# Patient Record
Sex: Male | Born: 1955 | Race: Black or African American | Hispanic: No | Marital: Married | Smoking: Former smoker
Health system: Southern US, Community
[De-identification: ages and names within clinical notes are randomized; demographics above are authoritative.]

## PROBLEM LIST (undated history)

## (undated) DIAGNOSIS — N281 Cyst of kidney, acquired: Secondary | ICD-10-CM

## (undated) DIAGNOSIS — M1990 Unspecified osteoarthritis, unspecified site: Secondary | ICD-10-CM

## (undated) DIAGNOSIS — N433 Hydrocele, unspecified: Secondary | ICD-10-CM

## (undated) DIAGNOSIS — M5430 Sciatica, unspecified side: Secondary | ICD-10-CM

## (undated) DIAGNOSIS — R011 Cardiac murmur, unspecified: Secondary | ICD-10-CM

## (undated) DIAGNOSIS — R897 Abnormal histological findings in specimens from other organs, systems and tissues: Secondary | ICD-10-CM

## (undated) DIAGNOSIS — C801 Malignant (primary) neoplasm, unspecified: Secondary | ICD-10-CM

## (undated) DIAGNOSIS — N503 Cyst of epididymis: Secondary | ICD-10-CM

## (undated) DIAGNOSIS — I10 Essential (primary) hypertension: Secondary | ICD-10-CM

## (undated) DIAGNOSIS — K429 Umbilical hernia without obstruction or gangrene: Secondary | ICD-10-CM

## (undated) DIAGNOSIS — R062 Wheezing: Secondary | ICD-10-CM

## (undated) DIAGNOSIS — N442 Benign cyst of testis: Secondary | ICD-10-CM

## (undated) DIAGNOSIS — E7800 Pure hypercholesterolemia, unspecified: Secondary | ICD-10-CM

## (undated) HISTORY — PX: PROSTATE BIOPSY: SHX241

## (undated) HISTORY — PX: INNER EAR SURGERY: SHX679

## (undated) HISTORY — PX: TONSILLECTOMY: SUR1361

---

## 1978-04-23 HISTORY — PX: CIRCUMCISION: SUR203

## 2006-11-07 ENCOUNTER — Emergency Department (HOSPITAL_COMMUNITY): Admission: EM | Admit: 2006-11-07 | Discharge: 2006-11-07 | Payer: Self-pay | Admitting: Emergency Medicine

## 2009-10-27 ENCOUNTER — Emergency Department (HOSPITAL_COMMUNITY): Admission: EM | Admit: 2009-10-27 | Discharge: 2009-10-27 | Payer: Self-pay | Admitting: Emergency Medicine

## 2010-02-27 ENCOUNTER — Encounter (INDEPENDENT_AMBULATORY_CARE_PROVIDER_SITE_OTHER): Payer: Self-pay | Admitting: Internal Medicine

## 2010-02-27 LAB — CONVERTED CEMR LAB
ALT: 22 units/L (ref 0–53)
Basophils Relative: 1 % (ref 0–1)
Calcium: 10 mg/dL (ref 8.4–10.5)
Creatinine, Ser: 1.07 mg/dL (ref 0.40–1.50)
Eosinophils Absolute: 0.2 10*3/uL (ref 0.0–1.2)
HCT: 41.2 % (ref 36.0–49.0)
Hemoglobin: 14.1 g/dL (ref 12.0–16.0)
LDL Cholesterol: 90 mg/dL (ref 0–109)
Lymphs Abs: 2.4 10*3/uL (ref 1.1–4.8)
MCHC: 34.2 g/dL (ref 31.0–37.0)
PSA: 5.27 ng/mL — ABNORMAL HIGH (ref <=4.00)
Platelets: 210 10*3/uL (ref 150–400)
Sodium: 140 meq/L (ref 135–145)
TSH: 1.111 microintl units/mL (ref 0.700–6.400)
Total CHOL/HDL Ratio: 5.4
Total Protein: 7.3 g/dL (ref 6.0–8.3)
VLDL: 51 mg/dL — ABNORMAL HIGH (ref 0–40)
WBC: 5.5 10*3/uL (ref 4.5–13.5)

## 2010-04-23 DIAGNOSIS — R897 Abnormal histological findings in specimens from other organs, systems and tissues: Secondary | ICD-10-CM

## 2010-04-23 HISTORY — DX: Abnormal histological findings in specimens from other organs, systems and tissues: R89.7

## 2011-03-31 ENCOUNTER — Encounter: Payer: Self-pay | Admitting: *Deleted

## 2011-03-31 ENCOUNTER — Emergency Department (HOSPITAL_COMMUNITY)
Admission: EM | Admit: 2011-03-31 | Discharge: 2011-03-31 | Disposition: A | Discharge status: Home / Self Care | Payer: Self-pay | Source: Home / Self Care | Attending: Emergency Medicine | Admitting: Emergency Medicine

## 2011-03-31 HISTORY — DX: Pure hypercholesterolemia, unspecified: E78.00

## 2011-03-31 HISTORY — DX: Essential (primary) hypertension: I10

## 2011-03-31 MED ORDER — ACETAMINOPHEN-CODEINE #3 300-30 MG PO TABS: 1.0000 | ORAL_TABLET | Freq: Four times a day (QID) | ORAL | Status: AC | PRN

## 2011-03-31 MED ORDER — OSELTAMIVIR PHOSPHATE 75 MG PO CAPS: 75.0000 mg | ORAL_CAPSULE | Freq: Two times a day (BID) | ORAL | Status: AC

## 2011-03-31 NOTE — ED Notes (Signed)
Pt with onset of cough/congestion/fever onset wednesday

## 2011-03-31 NOTE — ED Provider Notes (Addendum)
History     CSN: [id number] Arrival date & time: 03/31/2011  2:24 PM   First MD Initiated Contact with Patient 03/31/11 1319      No chief complaint on file.   (Consider location/radiation/quality/duration/timing/severity/associated sxs/prior treatment) HPI Comments: Coughing at night makes my stomach hurt, still feel congestion in my head" hurts to swallow and having body aches"  Patient is a 55 y.o. male presenting with cough.  Cough This is a new problem. The current episode started more than 2 days ago. The problem occurs hourly. The problem has been gradually worsening. The cough is non-productive. The maximum temperature recorded prior to his arrival was 101 to 101.9 F. Associated symptoms include chills, ear congestion, ear pain, headaches, rhinorrhea, sore throat and myalgias. Pertinent negatives include no weight loss, no shortness of breath and no wheezing. He has tried decongestants for the symptoms. The treatment provided no relief. He is not a smoker. His past medical history is significant for pneumonia. His past medical history does not include emphysema or asthma.    Past Medical History  Diagnosis Date  . Hypertension   . High cholesterol     History reviewed. No pertinent past surgical history.  History reviewed. No pertinent family history.  History  Substance Use Topics  . Smoking status: Former Games developer  . Smokeless tobacco: Not on file  . Alcohol Use: No      Review of Systems  Constitutional: Positive for chills. Negative for weight loss.  HENT: Positive for ear pain, sore throat and rhinorrhea.   Respiratory: Positive for cough. Negative for shortness of breath and wheezing.   Musculoskeletal: Positive for myalgias.  Neurological: Positive for headaches.    Allergies  Review of patient's allergies indicates no known allergies.  Home Medications   Current Outpatient Rx  Name Route Sig Dispense Refill  . AMLODIPINE BESYLATE 5 MG PO TABS Oral  Take 5 mg by mouth daily.      Marland Kitchen DOXAZOSIN MESYLATE 2 MG PO TABS Oral Take 2 mg by mouth at bedtime.      . OMEGA-3 FATTY ACIDS 1000 MG PO CAPS Oral Take 2 g by mouth daily.      Marland Kitchen LISINOPRIL-HYDROCHLOROTHIAZIDE 20-25 MG PO TABS Oral Take 1 tablet by mouth daily.      Marland Kitchen METOPROLOL TARTRATE 50 MG PO TABS Oral Take 100 mg by mouth 2 (two) times daily.      . MULTI-VITAMIN/MINERALS PO TABS Oral Take 1 tablet by mouth daily.      . ACETAMINOPHEN-CODEINE #3 300-30 MG PO TABS Oral Take 1-2 tablets by mouth every 6 (six) hours as needed for pain. 15 tablet 0  . OSELTAMIVIR PHOSPHATE 75 MG PO CAPS Oral Take 1 capsule (75 mg total) by mouth every 12 (twelve) hours. 10 capsule 0    BP 140/79  Pulse 74  Temp(Src) 100.1 F (37.8 C) (Oral)  Resp 20  SpO2 100%  Physical Exam  Nursing note and vitals reviewed. Constitutional: He appears well-developed and well-nourished. No distress.  HENT:  Head: Normocephalic.  Right Ear: Tympanic membrane normal.  Left Ear: Tympanic membrane normal.  Nose: Rhinorrhea present.  Mouth/Throat: Uvula is midline. Posterior oropharyngeal erythema present.  Eyes: Pupils are equal, round, and reactive to light.  Neck: Normal range of motion. No JVD present.  Cardiovascular: Normal rate.   Pulmonary/Chest: Effort normal and breath sounds normal. No respiratory distress. He has no decreased breath sounds. He has no wheezes. He has no rhonchi. He has no  rales.  Abdominal: Soft.  Musculoskeletal: Normal range of motion.  Lymphadenopathy:    He has no cervical adenopathy.  Neurological: He is alert.  Skin: Skin is warm.    ED Course  Procedures (including critical care time)  Labs Reviewed - No data to display No results found.   1. Influenza       MDM  ILI x 3 days. Normal lung exam- Comfortable + risk factors        Jimmie Molly, MD 03/31/11 1515  Jimmie Molly, MD 03/31/11 [phone]

## 2011-10-31 ENCOUNTER — Ambulatory Visit (HOSPITAL_COMMUNITY)
Admission: RE | Admit: 2011-10-31 | Discharge: 2011-10-31 | Disposition: A | Discharge status: Home / Self Care | Payer: Self-pay | Source: Ambulatory Visit | Attending: Family Medicine | Admitting: Family Medicine

## 2011-10-31 DIAGNOSIS — I517 Cardiomegaly: Secondary | ICD-10-CM | POA: Insufficient documentation

## 2011-10-31 DIAGNOSIS — I10 Essential (primary) hypertension: Secondary | ICD-10-CM | POA: Insufficient documentation

## 2011-10-31 DIAGNOSIS — I38 Endocarditis, valve unspecified: Secondary | ICD-10-CM

## 2011-10-31 DIAGNOSIS — E785 Hyperlipidemia, unspecified: Secondary | ICD-10-CM | POA: Insufficient documentation

## 2011-10-31 DIAGNOSIS — R011 Cardiac murmur, unspecified: Secondary | ICD-10-CM | POA: Insufficient documentation

## 2011-11-25 ENCOUNTER — Encounter (HOSPITAL_COMMUNITY): Payer: Self-pay | Admitting: *Deleted

## 2011-11-25 ENCOUNTER — Emergency Department (HOSPITAL_COMMUNITY)
Admission: EM | Admit: 2011-11-25 | Discharge: 2011-11-25 | Disposition: A | Discharge status: Home / Self Care | Payer: Self-pay | Attending: Emergency Medicine | Admitting: Emergency Medicine

## 2011-11-25 DIAGNOSIS — Z87891 Personal history of nicotine dependence: Secondary | ICD-10-CM | POA: Insufficient documentation

## 2011-11-25 DIAGNOSIS — I10 Essential (primary) hypertension: Secondary | ICD-10-CM | POA: Insufficient documentation

## 2011-11-25 DIAGNOSIS — IMO0002 Reserved for concepts with insufficient information to code with codable children: Secondary | ICD-10-CM | POA: Insufficient documentation

## 2011-11-25 DIAGNOSIS — M5416 Radiculopathy, lumbar region: Secondary | ICD-10-CM

## 2011-11-25 DIAGNOSIS — E7800 Pure hypercholesterolemia, unspecified: Secondary | ICD-10-CM | POA: Insufficient documentation

## 2011-11-25 HISTORY — DX: Abnormal histological findings in specimens from other organs, systems and tissues: R89.7

## 2011-11-25 MED ORDER — OXYCODONE-ACETAMINOPHEN 5-325 MG PO TABS: 2.0000 | ORAL_TABLET | Freq: Four times a day (QID) | ORAL | Status: DC | PRN

## 2011-11-25 MED ORDER — PREDNISONE 20 MG PO TABS: ORAL_TABLET | ORAL | Status: AC

## 2011-11-25 NOTE — ED Notes (Signed)
Patient is alert and oriented x.   Patient was explained discharge instructions and no questions were asked.  Patient's wife is here to transport the patient home.

## 2011-11-25 NOTE — ED Notes (Signed)
Reports lower left side back pain that started yesterday, denies injury to back, pain radiates down left leg.

## 2011-11-25 NOTE — Discharge Instructions (Signed)
SEEK IMMEDIATE MEDICAL ATTENTION IF: New numbness, tingling, weakness, or problem with the use of your arms or legs.  Severe back pain not relieved with medications.  Change in bowel or bladder control.  Increasing pain in any areas of the body (such as chest or abdominal pain).  Shortness of breath, dizziness or fainting.  Nausea (feeling sick to your stomach), vomiting, fever, or sweats.  

## 2011-11-25 NOTE — ED Provider Notes (Signed)
History    Scribed for [name], MD, the patient was seen in room TR09C/TR09C. This chart was scribed by Katha Cabal.   CSN: [id number]  Arrival date & time 11/25/11  1811   First MD Initiated Contact with Patient 11/25/11 1941      Chief Complaint  Patient presents with  . Back Pain    (Consider location/radiation/quality/duration/timing/severity/associated sxs/prior treatment) HPI [name], MD entered patient's room at 7:42 PM   Rodney Maynard is a 56 y.o. male who presents to the Emergency Department complaining of sudden onset of severe lower back pain with associated weakness and numbness.  Patient stated he was standing yesterday when pain began.  Pain has been constant and is worse with certain positions.  Patient fell today and reports weakness and numbness today.  Pain radiates down left leg.  Symptoms are not associated with cough, chest pain, fever, abdominal pain, no changes in bladder and bowel changes.  Patient has history of hypertension and hypercholesterolemia.      PCP [facility]     Past Medical History  Diagnosis Date  . Hypertension   . High cholesterol   . Abnormal prostate biopsy 2012    Past Surgical History  Procedure Date  . Circumcision 1980  . Inner ear surgery   . Tonsillectomy     History reviewed. No pertinent family history.  History  Substance Use Topics  . Smoking status: Former Games developer  . Smokeless tobacco: Not on file  . Alcohol Use: No      Review of Systems 10 Systems reviewed and all are negative for acute change except as noted in the HPI.   Allergies  Review of patient's allergies indicates no known allergies.  Home Medications   Current Outpatient Rx  Name Route Sig Dispense Refill  . AMLODIPINE BESYLATE 5 MG PO TABS Oral Take 5 mg by mouth daily.      Marland Kitchen DOXAZOSIN MESYLATE 2 MG PO TABS Oral Take 2 mg by mouth at bedtime.      . OMEGA-3 FATTY ACIDS 1000 MG PO CAPS Oral Take 2 g by mouth  daily.      Marland Kitchen LISINOPRIL-HYDROCHLOROTHIAZIDE 20-25 MG PO TABS Oral Take 1 tablet by mouth daily.      Marland Kitchen METOPROLOL TARTRATE 50 MG PO TABS Oral Take 100 mg by mouth 2 (two) times daily.      . MULTI-VITAMIN/MINERALS PO TABS Oral Take 1 tablet by mouth daily.      . OXYCODONE-ACETAMINOPHEN 5-325 MG PO TABS Oral Take 2 tablets by mouth every 6 (six) hours as needed for pain. 20 tablet 0  . PREDNISONE 20 MG PO TABS  3 tabs po day one, then 2 po daily x 4 days 11 tablet 0    BP 176/102  Pulse 59  Temp 98.3 F (36.8 C) (Oral)  Resp 16  SpO2 96%  Physical Exam  Nursing note and vitals reviewed. Constitutional:       Awake, alert, nontoxic appearance with baseline speech.  HENT:  Head: Atraumatic.  Eyes: Pupils are equal, round, and reactive to light. Right eye exhibits no discharge. Left eye exhibits no discharge.  Neck: Neck supple.  Cardiovascular: Normal rate and regular rhythm.   No murmur heard. Pulmonary/Chest: Effort normal and breath sounds normal. No respiratory distress. He has no wheezes. He has no rales. He exhibits no tenderness.  Abdominal: Soft. Bowel sounds are normal. He exhibits no mass. There is no tenderness. There is  no rebound.  Musculoskeletal:       Thoracic back: He exhibits no tenderness.       Lumbar back: He exhibits no tenderness.       Back non tender.  DP pulse intact bilaterally with good color, decreased light touch to light lateral foot and medial left lower leg as compared to the right  Neurological:       Mental status baseline for patient.  Upper extremity motor strength and sensation intact and symmetric bilaterally.  Skin: No rash noted.  Psychiatric: He has a normal mood and affect.    ED Course  Procedures (including critical care time)   DIAGNOSTIC STUDIES: Oxygen Saturation is 96% on room air, normal by my interpretation.     COORDINATION OF CARE: 7:53 PM  Physical exam complete.   7:57 PM  Plan to discharge patient with steroids and  pain medication.  Patient agrees with plan.        LABS / RADIOLOGY:   Labs Reviewed - No data to display No results found.       MDM         MEDICATIONS GIVEN IN THE E.D. Scheduled Meds:   Continuous Infusions:       IMPRESSION: 1. Lumbar radiculopathy, acute      NEW MEDICATIONS: New Prescriptions   OXYCODONE-ACETAMINOPHEN (PERCOCET) 5-325 MG PER TABLET    Take 2 tablets by mouth every 6 (six) hours as needed for pain.   PREDNISONE (DELTASONE) 20 MG TABLET    3 tabs po day one, then 2 po daily x 4 days    I personally performed the services described in this documentation, which was scribed in my presence. The recorded information has been reviewed and considered.  Patient / Family / Caregiver informed of clinical course, understand medical decision-making process, and agree with plan.           [name], MD 12/01/11 [phone]

## 2011-11-25 NOTE — ED Notes (Signed)
Patient says his pain in his lower back started yesterday but her fell today.  Patient said he has been having muscle spasms in his left leg.  Patient said her fell today and his leg went out from under him.  His pain is severe on his left side and it radiates down his left leg.

## 2011-12-04 ENCOUNTER — Emergency Department (HOSPITAL_COMMUNITY)
Admission: EM | Admit: 2011-12-04 | Discharge: 2011-12-04 | Disposition: A | Discharge status: Home / Self Care | Payer: Self-pay | Attending: Emergency Medicine | Admitting: Emergency Medicine

## 2011-12-04 ENCOUNTER — Encounter (HOSPITAL_COMMUNITY): Payer: Self-pay | Admitting: Emergency Medicine

## 2011-12-04 DIAGNOSIS — IMO0002 Reserved for concepts with insufficient information to code with codable children: Secondary | ICD-10-CM | POA: Insufficient documentation

## 2011-12-04 DIAGNOSIS — M5416 Radiculopathy, lumbar region: Secondary | ICD-10-CM

## 2011-12-04 DIAGNOSIS — I10 Essential (primary) hypertension: Secondary | ICD-10-CM | POA: Insufficient documentation

## 2011-12-04 DIAGNOSIS — E7800 Pure hypercholesterolemia, unspecified: Secondary | ICD-10-CM | POA: Insufficient documentation

## 2011-12-04 DIAGNOSIS — Z87891 Personal history of nicotine dependence: Secondary | ICD-10-CM | POA: Insufficient documentation

## 2011-12-04 MED ORDER — OXYCODONE-ACETAMINOPHEN 5-325 MG PO TABS
2.0000 | ORAL_TABLET | Freq: Once | ORAL | Status: AC
  Administered 2011-12-04: 2 via ORAL
  Filled 2011-12-04: qty 2

## 2011-12-04 MED ORDER — OXYCODONE-ACETAMINOPHEN 5-325 MG PO TABS: 2.0000 | ORAL_TABLET | ORAL | Status: AC | PRN

## 2011-12-04 NOTE — ED Provider Notes (Signed)
Medical screening examination/treatment/procedure(s) were performed by non-physician practitioner and as supervising physician I was immediately available for consultation/collaboration.  Doug Sou, MD 12/04/11 1711

## 2011-12-04 NOTE — Progress Notes (Signed)
[facility] ED CM consulted by Fast track RN to assist with self pay pcp referral for [facility] pt.  He reports not being seen by [facility] in awhile Pt now with back pain going down his leg.  Pt states ED staff has mentioned a referral to a neurologist Triage EDP states neurologist would see pt after information faxed from ED.

## 2011-12-04 NOTE — Progress Notes (Signed)
Pt remains active with [facility] Has an active orange card that recently updated in May 2013 Card expires 03/21/12.  CM spoke with Sofie Hartigan coordinator to see [facility] CM consult possible for a neurologist Pt is also in agreement to "get up" $200 if needed to see a [facility] provider.  CM to fax information to [facility] provider and make an appointment as needed [facility] coordinator in to speak with pt

## 2011-12-04 NOTE — ED Notes (Signed)
Pt will be seen by Case Manager for assistance with follow up care

## 2011-12-04 NOTE — ED Provider Notes (Signed)
History     CSN: [id number]  Arrival date & time 12/04/11  1209   First MD Initiated Contact with Patient 12/04/11 1229      Chief Complaint  Patient presents with  . Back Pain  . Leg Pain    5 day hx pain in low back radiating to l/leg    (Consider location/radiation/quality/duration/timing/severity/associated sxs/prior treatment) HPI Comments: Rodney Maynard 56 y.o. male   The chief complaint is: Patient presents with:   Back Pain   Leg Pain - 5 day hx pain in low back radiating to l/leg    The patient c/o left leg pain . Onset was one week ago.  Start after a twisting motion that occurred with planted feet. He had sudden sharp shooting pain that wrapped around the front of his left leg and to the shin. Pain is 8/10. It has persisited.  He was seen here by Dr. Fonnie Jarvis. Percocet helped him. He was a patient [facility] and is uninsured. He could not afford follow up with the neurologist. Denies weakness, loss of bowel/bladder function or saddle anesthesia. Denies fevers, chills, fatigue, night sweats, unexplained weight loss.     Patient is a 56 y.o. male presenting with back pain and leg pain. The history is provided by the patient and medical records.  Back Pain  Associated symptoms include leg pain. Pertinent negatives include no chest pain, no fever, no dysuria and no weakness.  Leg Pain     Past Medical History  Diagnosis Date  . Hypertension   . High cholesterol   . Abnormal prostate biopsy 2012    Past Surgical History  Procedure Date  . Circumcision 1980  . Inner ear surgery   . Tonsillectomy   . Prostate biopsy     Family History  Problem Relation Age of Onset  . Family history unknown: Yes    History  Substance Use Topics  . Smoking status: Former Games developer  . Smokeless tobacco: Not on file  . Alcohol Use: No      Review of Systems  Constitutional: Negative for fever, chills and fatigue.  Respiratory: Negative for cough and chest  tightness.   Cardiovascular: Negative for chest pain.  Genitourinary: Negative for dysuria.  Musculoskeletal: Positive for back pain.  Neurological: Negative for weakness.    Allergies  Review of patient's allergies indicates no known allergies.  Home Medications   Current Outpatient Rx  Name Route Sig Dispense Refill  . AMLODIPINE BESYLATE 5 MG PO TABS Oral Take 5 mg by mouth daily.      Marland Kitchen DOXAZOSIN MESYLATE 2 MG PO TABS Oral Take 2 mg by mouth at bedtime.      . OMEGA-3 FATTY ACIDS 1000 MG PO CAPS Oral Take 2 g by mouth daily.      . IBUPROFEN 200 MG PO TABS Oral Take 200 mg by mouth every 6 (six) hours as needed. For pain    . LISINOPRIL-HYDROCHLOROTHIAZIDE 20-25 MG PO TABS Oral Take 1 tablet by mouth daily.      Marland Kitchen METOPROLOL TARTRATE 50 MG PO TABS Oral Take 100 mg by mouth 2 (two) times daily.      . MULTI-VITAMIN/MINERALS PO TABS Oral Take 1 tablet by mouth daily.      Marland Kitchen PREDNISONE 20 MG PO TABS  3 tabs po day one, then 2 po daily x 4 days 11 tablet 0  . OXYCODONE-ACETAMINOPHEN 5-325 MG PO TABS Oral Take 2 tablets by mouth every 4 (four) hours as needed  for pain. 40 tablet 0    BP 126/75  Pulse 61  Temp 98.1 F (36.7 C) (Oral)  Resp 18  SpO2 97%  Physical Exam  Nursing note and vitals reviewed. Constitutional: He is oriented to person, place, and time. He appears well-developed and well-nourished.       Patient is clearly uncomfortable.  HENT:  Head: Normocephalic and atraumatic.  Eyes: Conjunctivae are normal. Pupils are equal, round, and reactive to light. No scleral icterus.  Neck: Normal range of motion. Neck supple.  Cardiovascular: Normal rate and regular rhythm.   Pulmonary/Chest: Effort normal and breath sounds normal. No respiratory distress.  Abdominal: Soft. There is no tenderness.  Musculoskeletal:       TTP of the left lumbar region, buttocks, hip, and quads. ROM limited by pain.  Neurological: He is alert and oriented to person, place, and time.        Patient with decreased sensation over the shin of the left leg. DTRs wnl. Gait with limp. No weakness.  Skin: Skin is warm and dry.    ED Course  Procedures (including critical care time)  Labs Reviewed - No data to display No results found.  Patient with lumbar radiculopathy in the L3/L4 distribution. He is in sig. Pain. Trouble sleeping and unable to completehis duties at work. Patient is also uninsured and [facility]. I had social to consult with him for f/u options.  1. Lumbar radiculopathy, acute       MDM  D/c patient with pain medications. He should f/u with Neurology and find a PCP. Discussed reasons to seek immediate care. Patient expresses understanding and agrees with plan.         Arthor Captain, PA-C 12/04/11 1709

## 2011-12-04 NOTE — ED Notes (Signed)
Pt reports leg pain, and back pain x 5 days. Pain is intermittent, burning pain from hip to lower leg

## 2011-12-05 NOTE — Progress Notes (Signed)
[facility] ED CM able to fax referral to [facility] coordinator at [phone] after speaking with receptionist staff [phone] and leaving a voice message for Diane. Diane will call pt to make an appointment if referral approved Cm left her contact number for Diane for further questions Informed her that pt would like appointment any day prior to noon Receive fax confirmation of success at 1213 12/05/11

## 2011-12-05 NOTE — Progress Notes (Signed)
[facility] ED CM left pt a voice message informing him of completed referral to [facility] with pending call to him from Diane for appointment if referral approved.

## 2011-12-28 NOTE — Progress Notes (Signed)
[facility] ED CM worked with [facility] community liasion to attempt to assist pt with obtaining an appointment with [facility] CM, [place] to assist with pcp services. Pt also given contact number for [facility] CM to call as soon as possible. Pt states he had 12/25/11 appointment with neurologist and is scheduled to follow up again with neurologist

## 2012-01-03 ENCOUNTER — Other Ambulatory Visit: Payer: Self-pay | Admitting: Diagnostic Neuroimaging

## 2012-01-03 DIAGNOSIS — M545 Low back pain, unspecified: Secondary | ICD-10-CM

## 2012-01-03 DIAGNOSIS — R209 Unspecified disturbances of skin sensation: Secondary | ICD-10-CM

## 2012-01-18 ENCOUNTER — Other Ambulatory Visit: Payer: Self-pay

## 2012-07-28 ENCOUNTER — Ambulatory Visit: Payer: No Typology Code available for payment source | Attending: Internal Medicine | Admitting: Physical Therapy

## 2012-07-28 DIAGNOSIS — M545 Low back pain, unspecified: Secondary | ICD-10-CM | POA: Insufficient documentation

## 2012-07-28 DIAGNOSIS — M2560 Stiffness of unspecified joint, not elsewhere classified: Secondary | ICD-10-CM | POA: Insufficient documentation

## 2012-07-28 DIAGNOSIS — R293 Abnormal posture: Secondary | ICD-10-CM | POA: Insufficient documentation

## 2012-07-28 DIAGNOSIS — M6281 Muscle weakness (generalized): Secondary | ICD-10-CM | POA: Insufficient documentation

## 2012-07-30 ENCOUNTER — Ambulatory Visit: Payer: No Typology Code available for payment source | Admitting: Rehabilitation

## 2012-08-04 ENCOUNTER — Ambulatory Visit: Payer: No Typology Code available for payment source | Admitting: Rehabilitation

## 2012-08-11 ENCOUNTER — Ambulatory Visit: Payer: No Typology Code available for payment source | Admitting: Rehabilitation

## 2012-08-13 ENCOUNTER — Ambulatory Visit: Payer: No Typology Code available for payment source | Admitting: Rehabilitation

## 2012-08-18 ENCOUNTER — Ambulatory Visit: Payer: No Typology Code available for payment source | Admitting: Physical Therapy

## 2012-08-20 ENCOUNTER — Encounter: Payer: No Typology Code available for payment source | Admitting: Physical Therapy

## 2012-08-21 ENCOUNTER — Ambulatory Visit: Payer: No Typology Code available for payment source | Attending: Internal Medicine | Admitting: Rehabilitation

## 2012-08-21 DIAGNOSIS — M545 Low back pain, unspecified: Secondary | ICD-10-CM | POA: Insufficient documentation

## 2012-08-21 DIAGNOSIS — R293 Abnormal posture: Secondary | ICD-10-CM | POA: Insufficient documentation

## 2012-08-21 DIAGNOSIS — M6281 Muscle weakness (generalized): Secondary | ICD-10-CM | POA: Insufficient documentation

## 2012-08-21 DIAGNOSIS — M2560 Stiffness of unspecified joint, not elsewhere classified: Secondary | ICD-10-CM | POA: Insufficient documentation

## 2012-08-25 ENCOUNTER — Encounter: Payer: No Typology Code available for payment source | Admitting: Physical Therapy

## 2012-08-25 ENCOUNTER — Ambulatory Visit: Payer: No Typology Code available for payment source | Admitting: Physical Therapy

## 2012-08-27 ENCOUNTER — Encounter: Payer: No Typology Code available for payment source | Admitting: Physical Therapy

## 2012-09-01 ENCOUNTER — Ambulatory Visit: Payer: No Typology Code available for payment source | Admitting: Rehabilitation

## 2012-09-08 ENCOUNTER — Encounter: Payer: No Typology Code available for payment source | Admitting: Physical Therapy

## 2013-06-12 ENCOUNTER — Encounter (HOSPITAL_COMMUNITY): Payer: Self-pay | Admitting: Emergency Medicine

## 2013-06-12 ENCOUNTER — Emergency Department (HOSPITAL_COMMUNITY): Payer: No Typology Code available for payment source

## 2013-06-12 ENCOUNTER — Emergency Department (HOSPITAL_COMMUNITY)
Admission: EM | Admit: 2013-06-12 | Discharge: 2013-06-12 | Disposition: A | Discharge status: Home / Self Care | Payer: No Typology Code available for payment source | Attending: Emergency Medicine | Admitting: Emergency Medicine

## 2013-06-12 DIAGNOSIS — IMO0002 Reserved for concepts with insufficient information to code with codable children: Secondary | ICD-10-CM | POA: Insufficient documentation

## 2013-06-12 DIAGNOSIS — W010XXA Fall on same level from slipping, tripping and stumbling without subsequent striking against object, initial encounter: Secondary | ICD-10-CM | POA: Insufficient documentation

## 2013-06-12 DIAGNOSIS — G8929 Other chronic pain: Secondary | ICD-10-CM | POA: Insufficient documentation

## 2013-06-12 DIAGNOSIS — S0990XA Unspecified injury of head, initial encounter: Secondary | ICD-10-CM | POA: Insufficient documentation

## 2013-06-12 DIAGNOSIS — S199XXA Unspecified injury of neck, initial encounter: Principal | ICD-10-CM

## 2013-06-12 DIAGNOSIS — R519 Headache, unspecified: Secondary | ICD-10-CM

## 2013-06-12 DIAGNOSIS — Y9389 Activity, other specified: Secondary | ICD-10-CM | POA: Insufficient documentation

## 2013-06-12 DIAGNOSIS — M542 Cervicalgia: Secondary | ICD-10-CM

## 2013-06-12 DIAGNOSIS — Z79899 Other long term (current) drug therapy: Secondary | ICD-10-CM | POA: Insufficient documentation

## 2013-06-12 DIAGNOSIS — S0993XA Unspecified injury of face, initial encounter: Secondary | ICD-10-CM | POA: Insufficient documentation

## 2013-06-12 DIAGNOSIS — I10 Essential (primary) hypertension: Secondary | ICD-10-CM | POA: Insufficient documentation

## 2013-06-12 DIAGNOSIS — Z7982 Long term (current) use of aspirin: Secondary | ICD-10-CM | POA: Insufficient documentation

## 2013-06-12 DIAGNOSIS — Y929 Unspecified place or not applicable: Secondary | ICD-10-CM | POA: Insufficient documentation

## 2013-06-12 DIAGNOSIS — E7800 Pure hypercholesterolemia, unspecified: Secondary | ICD-10-CM | POA: Insufficient documentation

## 2013-06-12 DIAGNOSIS — W009XXA Unspecified fall due to ice and snow, initial encounter: Secondary | ICD-10-CM

## 2013-06-12 DIAGNOSIS — Z87891 Personal history of nicotine dependence: Secondary | ICD-10-CM | POA: Insufficient documentation

## 2013-06-12 DIAGNOSIS — W1809XA Striking against other object with subsequent fall, initial encounter: Secondary | ICD-10-CM | POA: Insufficient documentation

## 2013-06-12 DIAGNOSIS — R51 Headache: Secondary | ICD-10-CM

## 2013-06-12 MED ORDER — TRAMADOL HCL 50 MG PO TABS: 50.0000 mg | ORAL_TABLET | Freq: Four times a day (QID) | ORAL | Status: DC | PRN

## 2013-06-12 MED ORDER — TRAMADOL HCL 50 MG PO TABS
50.0000 mg | ORAL_TABLET | Freq: Once | ORAL | Status: AC
  Administered 2013-06-12: 50 mg via ORAL
  Filled 2013-06-12: qty 1

## 2013-06-12 NOTE — ED Notes (Signed)
PT ambulated with baseline gait; VSS; A&Ox3; no signs of distress; respirations even and unlabored; skin warm and dry; no questions upon discharge.  

## 2013-06-12 NOTE — ED Provider Notes (Signed)
CSN: [id number]     Arrival date & time 06/12/13  1517 History  This chart was scribed for non-physician practitioner, Noland Fordyce, PA-C working with Osvaldo Shipper, MD by Frederich Balding, ED scribe. This patient was seen in room TR09C/TR09C and the patient's care was started at 7:47 PM.   Chief Complaint  Patient presents with  . Fall   The history is provided by the patient. No language interpreter was used.   HPI Comments: Rodney Maynard is a 58 y.o. male who presents to the Emergency Department complaining of a fall that occurred earlier today. Pt slipped on ice, hit the back of his head and landed on his back. He states he got dizzy after he fell but denies LOC. He has gradual onset, throbbing neck pain that goes into his shoulders. Rates the pain 8/10. He states he is also having lower back pain but it is chronic. Pt has taken motrin with some relief. Denies numbness or tingling.   Past Medical History  Diagnosis Date  . Hypertension   . High cholesterol   . Abnormal prostate biopsy 2012   Past Surgical History  Procedure Laterality Date  . Circumcision  1980  . Inner ear surgery    . Tonsillectomy    . Prostate biopsy     No family history on file. History  Substance Use Topics  . Smoking status: Former Research scientist (life sciences)  . Smokeless tobacco: Not on file  . Alcohol Use: No    Review of Systems  Musculoskeletal: Positive for arthralgias, back pain and neck pain.  Neurological: Negative for numbness.  All other systems reviewed and are negative.   Allergies  Review of patient's allergies indicates no known allergies.  Home Medications   Current Outpatient Rx  Name  Route  Sig  Dispense  Refill  . amLODipine (NORVASC) 5 MG tablet   Oral   Take 5 mg by mouth daily.           Marland Kitchen aspirin 81 MG tablet   Oral   Take 81 mg by mouth daily.         Marland Kitchen doxazosin (CARDURA) 2 MG tablet   Oral   Take 2 mg by mouth at bedtime.           . fish oil-omega-3 fatty acids  1000 MG capsule   Oral   Take 2 g by mouth daily.           Marland Kitchen ibuprofen (ADVIL,MOTRIN) 200 MG tablet   Oral   Take 200 mg by mouth every 6 (six) hours as needed. For pain         . lisinopril-hydrochlorothiazide (PRINZIDE,ZESTORETIC) 20-25 MG per tablet   Oral   Take 1 tablet by mouth daily.           . metoprolol (LOPRESSOR) 100 MG tablet   Oral   Take 50 mg by mouth 2 (two) times daily.         . Multiple Vitamins-Minerals (MULTIVITAMIN WITH MINERALS) tablet   Oral   Take 1 tablet by mouth daily.           . traMADol (ULTRAM) 50 MG tablet   Oral   Take 1 tablet (50 mg total) by mouth every 6 (six) hours as needed.   15 tablet   0    BP 189/101  Pulse 56  Temp(Src) 98 F (36.7 C) (Oral)  Resp 20  SpO2 95%  Physical Exam  Nursing note and vitals reviewed.  Constitutional: He is oriented to person, place, and time. He appears well-developed and well-nourished.  HENT:  Head: Normocephalic and atraumatic.  Eyes: EOM are normal.  Neck: Normal range of motion.  Cardiovascular: Normal rate.   Pulmonary/Chest: Effort normal.  Musculoskeletal: Normal range of motion.  Mildly tender at base of skull. No crepitus or step offs. Skin intact. No ecchymosis or erythema. Full ROM of upper extremities bilaterally.   Neurological: He is alert and oriented to person, place, and time. He has normal strength. No sensory deficit. Gait normal. GCS eye subscore is 4. GCS verbal subscore is 5. GCS motor subscore is 6.  CN II-XII grossly in tact. Alert and oriented x3.  5/5 grip strength. Antalgic gait.  Skin: Skin is warm and dry.  Psychiatric: He has a normal mood and affect. His behavior is normal.    ED Course  Procedures (including critical care time)  DIAGNOSTIC STUDIES: Oxygen Saturation is 95% on RA, adequate by my interpretation.    COORDINATION OF CARE: 7:51 PM-Discussed treatment plan which includes xray and pain medication with pt at bedside and pt agreed to  plan.   Labs Review Labs Reviewed - No data to display Imaging Review Dg Cervical Spine Complete  06/12/2013   CLINICAL DATA:  Fall.  EXAM: CERVICAL SPINE  4+ VIEWS  COMPARISON:  None.  FINDINGS: Soft tissue structures normal. Diffuse degenerative change. Multilevel degenerative endplate osteophyte formation and facet hypertrophy is present. Bilateral neural foraminal narrowing present. Pulmonary apices are clear.  IMPRESSION: Diffuse degenerative change.  No acute abnormality.   Electronically Signed   By: Marcello Moores  Register   On: 06/12/2013 21:16    EKG Interpretation   None       MDM   Final diagnoses:  Neck pain  Fall from slipping on ice  Head pain    Pt c/o headache and neck pain after slipping and falling on ice earlier today. Denies LOC, nausea or change in vision. Normal neuro exam, however pt is tender along base of skull and upper cervical spine. Low concern for fracture, although will get plain films to make sure no fractures or bony deformities.   Plain films: no acute abnormality.   Will tx pain. Rx: tramadol. Advised to f/u with PCP later next week if not improving. Return precautions provided. Pt verbalized understanding and agreement with tx plan.   I personally performed the services described in this documentation, which was scribed in my presence. The recorded information has been reviewed and is accurate.   Noland Fordyce, PA-C 06/13/13 [phone]

## 2013-06-12 NOTE — ED Notes (Signed)
Pt still in xray; wife at bedside; no needs at this time.

## 2013-06-12 NOTE — ED Notes (Signed)
PT reports BP usually runs in 170's but hasn't taken BP meds this evening.

## 2013-06-12 NOTE — ED Notes (Signed)
C collar removed at this time per PA.

## 2013-06-12 NOTE — ED Notes (Signed)
Pt reports hit head on car bumper and feels pain in back of head. Has minor headache. Reports when he hit it pushed his head forward quickly and now has neck pain. Denies LOC. Pt in c collar.

## 2013-06-12 NOTE — ED Notes (Signed)
The pt fell in the ice earlier today.  He has pain in his neck and shoulders since

## 2013-06-13 NOTE — ED Provider Notes (Signed)
Medical screening examination/treatment/procedure(s) were performed by non-physician practitioner and as supervising physician I was immediately available for consultation/collaboration.  EKG Interpretation   None         Osvaldo Shipper, MD 06/13/13 1505

## 2013-10-17 ENCOUNTER — Encounter (HOSPITAL_COMMUNITY): Payer: Self-pay | Admitting: Emergency Medicine

## 2013-10-17 ENCOUNTER — Emergency Department (HOSPITAL_COMMUNITY)
Admission: EM | Admit: 2013-10-17 | Discharge: 2013-10-17 | Disposition: A | Discharge status: Home / Self Care | Payer: No Typology Code available for payment source | Attending: Emergency Medicine | Admitting: Emergency Medicine

## 2013-10-17 ENCOUNTER — Emergency Department (HOSPITAL_COMMUNITY): Payer: No Typology Code available for payment source

## 2013-10-17 DIAGNOSIS — Z8546 Personal history of malignant neoplasm of prostate: Secondary | ICD-10-CM | POA: Insufficient documentation

## 2013-10-17 DIAGNOSIS — M5442 Lumbago with sciatica, left side: Secondary | ICD-10-CM

## 2013-10-17 DIAGNOSIS — Z87891 Personal history of nicotine dependence: Secondary | ICD-10-CM | POA: Insufficient documentation

## 2013-10-17 DIAGNOSIS — I10 Essential (primary) hypertension: Secondary | ICD-10-CM | POA: Insufficient documentation

## 2013-10-17 DIAGNOSIS — M5430 Sciatica, unspecified side: Secondary | ICD-10-CM | POA: Insufficient documentation

## 2013-10-17 HISTORY — DX: Malignant (primary) neoplasm, unspecified: C80.1

## 2013-10-17 MED ORDER — METHOCARBAMOL 500 MG PO TABS
500.0000 mg | ORAL_TABLET | Freq: Once | ORAL | Status: AC
  Administered 2013-10-17: 500 mg via ORAL
  Filled 2013-10-17: qty 1

## 2013-10-17 MED ORDER — METHOCARBAMOL 500 MG PO TABS: 1000.0000 mg | ORAL_TABLET | Freq: Four times a day (QID) | ORAL | Status: DC

## 2013-10-17 MED ORDER — HYDROCODONE-ACETAMINOPHEN 5-325 MG PO TABS: ORAL_TABLET | ORAL | Status: DC

## 2013-10-17 MED ORDER — HYDROCODONE-ACETAMINOPHEN 5-325 MG PO TABS
1.0000 | ORAL_TABLET | Freq: Once | ORAL | Status: AC
  Administered 2013-10-17: 1 via ORAL
  Filled 2013-10-17: qty 1

## 2013-10-17 NOTE — ED Notes (Signed)
Called x 1 no answer

## 2013-10-17 NOTE — Discharge Instructions (Signed)
Please read and follow all provided instructions.  Your diagnoses today include:  1. Left-sided low back pain with left-sided sciatica     Tests performed today include:  Vital signs - see below for your results today  X-ray of back - shows arthritis but no other problems  Medications prescribed:   Vicodin (hydrocodone/acetaminophen) - narcotic pain medication  DO NOT drive or perform any activities that require you to be awake and alert because this medicine can make you drowsy. BE VERY CAREFUL not to take multiple medicines containing Tylenol (also called acetaminophen). Doing so can lead to an overdose which can damage your liver and cause liver failure and possibly death.   Robaxin (methocarbamol) - muscle relaxer medication  DO NOT drive or perform any activities that require you to be awake and alert because this medicine can make you drowsy.   Take any prescribed medications only as directed.  Home care instructions:   Follow any educational materials contained in this packet  Please rest, use ice or heat on your back for the next several days  Do not lift, push, pull anything more than 10 pounds for the next week  Follow-up instructions: Please follow-up with your primary care provider in the next 1 week for further evaluation of your symptoms.   Return instructions:  SEEK IMMEDIATE MEDICAL ATTENTION IF YOU HAVE:  New numbness, tingling, weakness, or problem with the use of your arms or legs  Severe back pain not relieved with medications  Loss control of your bowels or bladder  Increasing pain in any areas of the body (such as chest or abdominal pain)  Shortness of breath, dizziness, or fainting.   Worsening nausea (feeling sick to your stomach), vomiting, fever, or sweats  Any other emergent concerns regarding your health   Additional Information:  Your vital signs today were: BP 139/90   Pulse 61   Temp(Src) 98 F (36.7 C) (Oral)   Resp 20   SpO2  97% If your blood pressure (BP) was elevated above 135/85 this visit, please have this repeated by your doctor within one month. --------------

## 2013-10-17 NOTE — ED Notes (Signed)
Pt states the pain is left lower back and going down the front of his leg. Has sciatica.

## 2013-10-17 NOTE — ED Notes (Signed)
Called x3 no response

## 2013-10-17 NOTE — ED Provider Notes (Signed)
CSN: [id number]     Arrival date & time 10/17/13  1458 History  This chart was scribed for non-physician practitioner working with Wandra Arthurs, MD by Mercy Moore, ED Scribe. This patient was seen in room TR04C/TR04C and the patient's care was started at 4:28 PM.   Chief Complaint  Patient presents with  . Back Pain     The history is provided by the patient. No language interpreter was used.   HPI Comments: Rodney Maynard is a 58 y.o. male with history of sciatica and prostate cancer who presents to the Emergency Department complaining of aching lower left back pain that radiates down the length of left leg, onset two days ago.  Patient denies causative injury or recent trauma. Patient reports elevating the leg last night, but the pain was so discomforting he was unable to sleep. History of similar pain on the right side. Patient reports that his pain was present at his appointment yesterday for his prostate cancer: patient states he was having his PSA levels checked and an updated biospy. Patient reports diarrhea yesterday, but denies bowel issues and urinary symptoms.    Past Medical History  Diagnosis Date  . Hypertension   . High cholesterol   . Abnormal prostate biopsy 2012  . Cancer    Past Surgical History  Procedure Laterality Date  . Circumcision  1980  . Inner ear surgery    . Tonsillectomy    . Prostate biopsy     No family history on file. History  Substance Use Topics  . Smoking status: Former Research scientist (life sciences)  . Smokeless tobacco: Not on file  . Alcohol Use: No    Review of Systems  Constitutional: Negative for fever, chills and unexpected weight change.  Gastrointestinal: Negative for constipation.       Neg for fecal incontinence  Genitourinary: Negative for dysuria, hematuria, flank pain and difficulty urinating.       Negative for urinary incontinence or retention  Musculoskeletal: Positive for back pain.  Neurological: Negative for weakness and numbness.        Negative for saddle paresthesias       Allergies  Review of patient's allergies indicates no known allergies.  Home Medications   Prior to Admission medications   Medication Sig Start Date End Date Taking? Authorizing Chan Rosasco  amLODipine (NORVASC) 5 MG tablet Take 5 mg by mouth daily.      Historical Idelia Caudell, MD  aspirin 81 MG tablet Take 81 mg by mouth daily.    Historical Renell Coaxum, MD  doxazosin (CARDURA) 2 MG tablet Take 2 mg by mouth at bedtime.      Historical Teri Diltz, MD  fish oil-omega-3 fatty acids 1000 MG capsule Take 2 g by mouth daily.      Historical Vayla Wilhelmi, MD  ibuprofen (ADVIL,MOTRIN) 200 MG tablet Take 200 mg by mouth every 6 (six) hours as needed. For pain    Historical Deshannon Hinchliffe, MD  lisinopril-hydrochlorothiazide (PRINZIDE,ZESTORETIC) 20-25 MG per tablet Take 1 tablet by mouth daily.      Historical Savir Blanke, MD  metoprolol (LOPRESSOR) 100 MG tablet Take 50 mg by mouth 2 (two) times daily.    Historical Rodney Wigger, MD  Multiple Vitamins-Minerals (MULTIVITAMIN WITH MINERALS) tablet Take 1 tablet by mouth daily.      Historical Borghild Thaker, MD  traMADol (ULTRAM) 50 MG tablet Take 1 tablet (50 mg total) by mouth every 6 (six) hours as needed. 06/12/13   Noland Fordyce, PA-C   Triage Vitals: BP 154/90  Pulse 62  Temp(Src) 98 F (36.7 C) (Oral)  Resp 20  SpO2 98%  Physical Exam  Nursing note and vitals reviewed. Constitutional: He appears well-developed and well-nourished. No distress.  HENT:  Head: Normocephalic and atraumatic.  Eyes: Conjunctivae and EOM are normal.  Neck: Normal range of motion. Neck supple.  Cardiovascular: Normal rate.   Pulmonary/Chest: Effort normal. No respiratory distress.  Abdominal: Soft. There is no tenderness. There is no CVA tenderness.  Musculoskeletal: Normal range of motion. He exhibits tenderness.       Cervical back: He exhibits normal range of motion, no tenderness and no bony tenderness.       Thoracic back: He exhibits normal  range of motion, no tenderness and no bony tenderness.       Lumbar back: He exhibits tenderness. He exhibits normal range of motion and no bony tenderness.       Back:  No step-off noted with palpation of spine.   Neurological: He is alert. He has normal reflexes. No sensory deficit. He exhibits normal muscle tone.  5/5 strength in entire lower extremities bilaterally. No sensation deficit.   Skin: Skin is warm and dry.  Psychiatric: He has a normal mood and affect. His behavior is normal.    ED Course  Procedures (including critical care time) COORDINATION OF CARE: 4:33 PM- Will order back X-ray. Discussed treatment plan with patient at bedside and patient agreed to plan.   5:37 PM- Will prescribe muscle relaxer to manage pain a home.    Labs Review Labs Reviewed - No data to display  Imaging Review Dg Lumbar Spine Complete  10/17/2013   CLINICAL DATA:  58 year old male with new left side pain, radiculopathy. Initial encounter.  EXAM: LUMBAR SPINE - COMPLETE 4+ VIEW  COMPARISON:  Lumbar radiographs 07/04/2012.  FINDINGS: Normal lumbar segmentation. Bone mineralization is within normal limits. Stable vertebral height and alignment, with mild grade 1 anterolisthesis at L4-L5. Evidence of chronic vacuum disc phenomena at L5-S1. Other disc spaces are relatively preserved. No pars fracture. Sacral ala and SI joints within normal limits. Grossly intact visualized lower thoracic levels.  IMPRESSION: No acute osseous abnormality identified in the lumbar spine. 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 Mild chronic grade 1 anterolisthesis at L4-L5.   Electronically Signed   By: Lars Pinks M.D.   On: 10/17/2013 17:27     EKG Interpretation None      Patient seen and examined. Work-up initiated. Medications ordered.   Vital signs reviewed and are as follows: Filed Vitals:   10/17/13 1731  BP: 139/90  Pulse: 61  Temp:   Resp:     5:44 PM No red flag s/s of low back pain. Patient  was counseled on back pain precautions and told to do activity as tolerated but do not lift, push, or pull heavy objects more than 10 pounds for the next week.  Patient counseled to use ice or heat on back for no longer than 15 minutes every hour.   Patient prescribed muscle relaxer and counseled on proper use of muscle relaxant medication.    Patient prescribed narcotic pain medicine and counseled on proper use of narcotic pain medications. Counseled not to combine this medication with others containing tylenol.   Urged patient not to drink alcohol, drive, or perform any other activities that requires focus while taking either of these medications.  Patient urged to follow-up with PCP if pain does not improve with treatment and rest or if pain becomes recurrent. Urged to return with  worsening severe pain, loss of bowel or bladder control, trouble walking.   The patient verbalizes understanding and agrees with the plan.   MDM   Final diagnoses:  Left-sided low back pain with left-sided sciatica   Patient with back pain. Also cancer history. X-ray neg, ordered due to pain being different location than past. No neurological deficits. Patient is ambulatory. No warning symptoms of back pain including: loss of bowel or bladder control, night sweats, waking from sleep with back pain, unexplained fevers or weight loss, h/o cancer, IVDU, recent trauma. No concern for cauda equina, epidural abscess, or other serious cause of back pain. Conservative measures such as rest, ice/heat and pain medicine indicated with PCP follow-up if no improvement with conservative management.   I personally performed the services described in this documentation, which was scribed in my presence. The recorded information has been reviewed and is accurate.    Carlisle Cater, PA-C 10/17/13 1746

## 2013-10-17 NOTE — ED Notes (Signed)
Patient took OTC's and prescribed medication without any relief of back pain. Patient is unable to tell me when he was seen last by his PCP.

## 2013-10-20 NOTE — ED Provider Notes (Signed)
Medical screening examination/treatment/procedure(s) were performed by non-physician practitioner and as supervising physician I was immediately available for consultation/collaboration.   EKG Interpretation None        Wandra Arthurs, MD 10/20/13 [phone]

## 2013-11-27 ENCOUNTER — Other Ambulatory Visit: Payer: Self-pay | Admitting: Urology

## 2014-01-04 ENCOUNTER — Encounter (HOSPITAL_COMMUNITY): Payer: Self-pay | Admitting: Pharmacy Technician

## 2014-01-11 ENCOUNTER — Encounter (INDEPENDENT_AMBULATORY_CARE_PROVIDER_SITE_OTHER): Payer: Self-pay

## 2014-01-11 ENCOUNTER — Encounter (HOSPITAL_COMMUNITY)
Admission: RE | Admit: 2014-01-11 | Discharge: 2014-01-11 | Disposition: A | Discharge status: Home / Self Care | Payer: No Typology Code available for payment source | Source: Ambulatory Visit | Attending: Urology | Admitting: Urology

## 2014-01-11 ENCOUNTER — Encounter (HOSPITAL_COMMUNITY): Payer: Self-pay

## 2014-01-11 ENCOUNTER — Ambulatory Visit (HOSPITAL_COMMUNITY)
Admission: RE | Admit: 2014-01-11 | Discharge: 2014-01-11 | Disposition: A | Discharge status: Home / Self Care | Payer: No Typology Code available for payment source | Source: Ambulatory Visit | Attending: Anesthesiology | Admitting: Anesthesiology

## 2014-01-11 DIAGNOSIS — Z01818 Encounter for other preprocedural examination: Secondary | ICD-10-CM | POA: Insufficient documentation

## 2014-01-11 DIAGNOSIS — Z87891 Personal history of nicotine dependence: Secondary | ICD-10-CM | POA: Diagnosis not present

## 2014-01-11 DIAGNOSIS — C61 Malignant neoplasm of prostate: Secondary | ICD-10-CM | POA: Insufficient documentation

## 2014-01-11 DIAGNOSIS — I10 Essential (primary) hypertension: Secondary | ICD-10-CM | POA: Insufficient documentation

## 2014-01-11 HISTORY — DX: Unspecified osteoarthritis, unspecified site: M19.90

## 2014-01-11 LAB — BASIC METABOLIC PANEL
Anion gap: 11 (ref 5–15)
BUN: 14 mg/dL (ref 6–23)
CALCIUM: 9.8 mg/dL (ref 8.4–10.5)
CO2: 27 mEq/L (ref 19–32)
CREATININE: 0.98 mg/dL (ref 0.50–1.35)
Chloride: 99 mEq/L (ref 96–112)
GFR calc non Af Amer: 88 mL/min — ABNORMAL LOW (ref >90)
Glucose, Bld: 100 mg/dL — ABNORMAL HIGH (ref 70–99)
Potassium: 4.1 mEq/L (ref 3.7–5.3)
Sodium: 137 mEq/L (ref 137–147)

## 2014-01-11 LAB — ABO/RH: ABO/RH(D): O POS

## 2014-01-11 LAB — CBC
HEMATOCRIT: 37.3 % — AB (ref 39.0–52.0)
Hemoglobin: 12.6 g/dL — ABNORMAL LOW (ref 13.0–17.0)
MCH: 30.1 pg (ref 26.0–34.0)
MCHC: 33.8 g/dL (ref 30.0–36.0)
MCV: 89 fL (ref 78.0–100.0)
Platelets: 213 10*3/uL (ref 150–400)
RBC: 4.19 MIL/uL — ABNORMAL LOW (ref 4.22–5.81)
RDW: 12.9 % (ref 11.5–15.5)
WBC: 5.2 10*3/uL (ref 4.0–10.5)

## 2014-01-11 NOTE — Patient Instructions (Addendum)
20 Rodney Maynard  01/11/2014   Your procedure is scheduled on:  9-23 -2015 Wednesday  Enter through [facility] Entrance and follow signs to [facility] Arrive at      0630  AM.  Call this number if you have problems the morning of surgery: [phone]  Or Presurgical Testing [phone].   For Living Will and/or Health Care Power Attorney Forms: please provide copy for your medical record,may bring AM of surgery(Forms should be already notarized -we do not provide this service).(Yes/ No information preferred today).  Remember: Follow any bowel prep instructions per MD office: Magnesium citrate and Fleet enema-use as per MD instructions. For Cpap use: Bring mask and tubing only.   Do not eat food/ or drink: After Midnight.      Take these medicines the morning of surgery with A SIP OF WATER: Amlodipine. Gabapentin. Metoprolol. Tamsulosin.   Do not wear jewelry, make-up or nail polish.  Do not wear lotions, powders, or perfumes. You may not  wear deodorant.  Do not shave 48 hours(2 days) prior to first CHG shower(legs and under arms).(Shaving face and neck okay.)  Do not bring valuables to the hospital.(Hospital is not responsible for lost valuables).  Contacts, dentures or removable bridgework, body piercing, hair pins may not be worn into surgery.  Leave suitcase in the car. After surgery it may be brought to your room.  For patients admitted to the hospital, checkout time is 11:00 AM the day of discharge.(Restricted visitors-Any Persons displaying flu-like symptoms or illness).    Patients discharged the day of surgery will not be allowed to drive home. Must have responsible person with you x 24 hours once discharged.  Name and phone number of your driver: Rodney Maynard- spouse [phone] cell  Special Instructions: CHG(Chlorhedine 4%-"Hibiclens","Betasept","Aplicare") Shower Use Special Wash: see special instructions.(avoid face and genitals)   Please read over the  following fact sheets that you were given:  Blood Transfusion fact sheet.  Remember : Type/Screen "Blue armbands" - may not be removed once applied(would result in being retested AM of surgery, if removed).  Failure to follow these instructions may result in Cancellation of your surgery.    WHAT IS A BLOOD TRANSFUSION? Blood Transfusion Information  A transfusion is the replacement of blood or some of its parts. Blood is made up of multiple cells which provide different functions.  Red blood cells carry oxygen and are used for blood loss replacement.  White blood cells fight against infection.  Platelets control bleeding.  Plasma helps clot blood.  Other blood products are available for specialized needs, such as hemophilia or other clotting disorders. BEFORE THE TRANSFUSION  Who gives blood for transfusions?   Healthy volunteers who are fully evaluated to make sure their blood is safe. This is blood bank blood. Transfusion therapy is the safest it has ever been in the practice of medicine. Before blood is taken from a donor, a complete history is taken to make sure that person has no history of diseases nor engages in risky social behavior (examples are intravenous drug use or sexual activity with multiple partners). The donor's travel history is screened to minimize risk of transmitting infections, such as malaria. The donated blood is tested for signs of infectious diseases, such as HIV and hepatitis. The blood is then tested to be sure it is compatible with you in order to minimize the chance of a transfusion reaction. If you or a relative donates blood, this is often done in  anticipation of surgery and is not appropriate for emergency situations. It takes many days to process the donated blood. RISKS AND COMPLICATIONS Although transfusion therapy is very safe and saves many lives, the main dangers of transfusion include:   Getting an infectious disease.  Developing a transfusion  reaction. This is an allergic reaction to something in the blood you were given. Every precaution is taken to prevent this. The decision to have a blood transfusion has been considered carefully by your caregiver before blood is given. Blood is not given unless the benefits outweigh the risks. AFTER THE TRANSFUSION  Right after receiving a blood transfusion, you will usually feel much better and more energetic. This is especially true if your red blood cells have gotten low (anemic). The transfusion raises the level of the red blood cells which carry oxygen, and this usually causes an energy increase.  The nurse administering the transfusion will monitor you carefully for complications. HOME CARE INSTRUCTIONS  No special instructions are needed after a transfusion. You may find your energy is better. Speak with your caregiver about any limitations on activity for underlying diseases you may have. SEEK MEDICAL CARE IF:   Your condition is not improving after your transfusion.  You develop redness or irritation at the intravenous (IV) site. SEEK IMMEDIATE MEDICAL CARE IF:  Any of the following symptoms occur over the next 12 hours:  Shaking chills.  You have a temperature by mouth above 102 F (38.9 C), not controlled by medicine.  Chest, back, or muscle pain.  People around you feel you are not acting correctly or are confused.  Shortness of breath or difficulty breathing.  Dizziness and fainting.  You get a rash or develop hives.  You have a decrease in urine output.  Your urine turns a dark color or changes to pink, red, or [place]. Any of the following symptoms occur over the next 10 days:  You have a temperature by mouth above 102 F (38.9 C), not controlled by medicine.  Shortness of breath.  Weakness after normal activity.  The white part of the eye turns yellow (jaundice).  You have a decrease in the amount of urine or are urinating less often.  Your urine turns a  dark color or changes to pink, red, or [place]. Document Released: 04/06/2000 Document Revised: 07/02/2011 Document Reviewed: 11/24/2007 ExitCare Patient Information 2014 [employer].  _____________________________________________________________________________________________________    [facility] - Preparing for Surgery Before surgery, you can play an important role.  Because skin is not sterile, your skin needs to be as free of germs as possible.  You can reduce the number of germs on your skin by washing with CHG (chlorahexidine gluconate) soap before surgery.  CHG is an antiseptic cleaner which kills germs and bonds with the skin to continue killing germs even after washing. Please DO NOT use if you have an allergy to CHG or antibacterial soaps.  If your skin becomes reddened/irritated stop using the CHG and inform your nurse when you arrive at Short Stay. Do not shave (including legs and underarms) for at least 48 hours prior to the first CHG shower.  You may shave your face/neck. Please follow these instructions carefully:  1.  Shower with CHG Soap the night before surgery and the  morning of Surgery.  2.  If you choose to wash your hair, wash your hair first as usual with your  normal  shampoo.  3.  After you shampoo, rinse your hair and body thoroughly to  remove the  shampoo.                           4.  Use CHG as you would any other liquid soap.  You can apply chg directly  to the skin and wash                       Gently with a scrungie or clean washcloth.  5.  Apply the CHG Soap to your body ONLY FROM THE NECK DOWN.   Do not use on face/ open                           Wound or open sores. Avoid contact with eyes, ears mouth and genitals (private parts).                       Wash face,  Genitals (private parts) with your normal soap.             6.  Wash thoroughly, paying special attention to the area where your surgery  will be performed.  7.  Thoroughly rinse your body with  warm water from the neck down.  8.  DO NOT shower/wash with your normal soap after using and rinsing off  the CHG Soap.                9.  Pat yourself dry with a clean towel.            10.  Wear clean pajamas.            11.  Place clean sheets on your bed the night of your first shower and do not  sleep with pets. Day of Surgery : Do not apply any lotions/deodorants the morning of surgery.  Please wear clean clothes to the hospital/surgery center.  FAILURE TO FOLLOW THESE INSTRUCTIONS MAY RESULT IN THE CANCELLATION OF YOUR SURGERY PATIENT SIGNATURE_________________________________  NURSE SIGNATURE__________________________________  ________________________________________________________________________

## 2014-01-11 NOTE — Pre-Procedure Instructions (Signed)
01-11-14 EKG/ CXR done today.

## 2014-01-13 ENCOUNTER — Encounter (HOSPITAL_COMMUNITY)
Admission: RE | Disposition: A | Discharge status: Home / Self Care | Payer: Self-pay | Source: Ambulatory Visit | Attending: Urology

## 2014-01-13 ENCOUNTER — Inpatient Hospital Stay (HOSPITAL_COMMUNITY)
Admission: RE | Admit: 2014-01-13 | Discharge: 2014-01-14 | DRG: 708 | Disposition: A | Discharge status: Home / Self Care | Payer: No Typology Code available for payment source | Source: Ambulatory Visit | Attending: Urology | Admitting: Urology

## 2014-01-13 ENCOUNTER — Encounter (HOSPITAL_COMMUNITY): Payer: Self-pay | Admitting: *Deleted

## 2014-01-13 ENCOUNTER — Inpatient Hospital Stay (HOSPITAL_COMMUNITY): Payer: No Typology Code available for payment source | Admitting: Anesthesiology

## 2014-01-13 ENCOUNTER — Encounter (HOSPITAL_COMMUNITY): Payer: No Typology Code available for payment source | Admitting: Anesthesiology

## 2014-01-13 DIAGNOSIS — Z87891 Personal history of nicotine dependence: Secondary | ICD-10-CM | POA: Diagnosis not present

## 2014-01-13 DIAGNOSIS — C61 Malignant neoplasm of prostate: Principal | ICD-10-CM | POA: Diagnosis present

## 2014-01-13 DIAGNOSIS — Z833 Family history of diabetes mellitus: Secondary | ICD-10-CM | POA: Diagnosis not present

## 2014-01-13 DIAGNOSIS — Z79899 Other long term (current) drug therapy: Secondary | ICD-10-CM

## 2014-01-13 DIAGNOSIS — I10 Essential (primary) hypertension: Secondary | ICD-10-CM | POA: Diagnosis present

## 2014-01-13 DIAGNOSIS — E669 Obesity, unspecified: Secondary | ICD-10-CM | POA: Diagnosis present

## 2014-01-13 DIAGNOSIS — Z6836 Body mass index (BMI) 36.0-36.9, adult: Secondary | ICD-10-CM | POA: Diagnosis not present

## 2014-01-13 DIAGNOSIS — Z23 Encounter for immunization: Secondary | ICD-10-CM

## 2014-01-13 HISTORY — PX: ROBOT ASSISTED LAPAROSCOPIC RADICAL PROSTATECTOMY: SHX5141

## 2014-01-13 HISTORY — PX: LYMPHADENECTOMY: SHX5960

## 2014-01-13 LAB — TYPE AND SCREEN
ABO/RH(D): O POS
Antibody Screen: NEGATIVE

## 2014-01-13 LAB — HEMOGLOBIN AND HEMATOCRIT, BLOOD
HCT: 34.5 % — ABNORMAL LOW (ref 39.0–52.0)
HEMOGLOBIN: 12.1 g/dL — AB (ref 13.0–17.0)

## 2014-01-13 SURGERY — ROBOTIC ASSISTED LAPAROSCOPIC RADICAL PROSTATECTOMY
Anesthesia: General

## 2014-01-13 MED ORDER — PROPOFOL 10 MG/ML IV BOLUS
INTRAVENOUS | Status: AC
  Filled 2014-01-13: qty 20

## 2014-01-13 MED ORDER — LIDOCAINE HCL (CARDIAC) 20 MG/ML IV SOLN
INTRAVENOUS | Status: DC | PRN
  Administered 2014-01-13: 50 mg via INTRAVENOUS

## 2014-01-13 MED ORDER — NEOSTIGMINE METHYLSULFATE 10 MG/10ML IV SOLN
INTRAVENOUS | Status: DC | PRN
  Administered 2014-01-13: 4 mg via INTRAVENOUS

## 2014-01-13 MED ORDER — FENTANYL CITRATE 0.05 MG/ML IJ SOLN
INTRAMUSCULAR | Status: AC
  Filled 2014-01-13: qty 5

## 2014-01-13 MED ORDER — SODIUM CHLORIDE 0.9 % IV BOLUS (SEPSIS)
1000.0000 mL | Freq: Once | INTRAVENOUS | Status: AC
  Administered 2014-01-13: 1000 mL via INTRAVENOUS

## 2014-01-13 MED ORDER — PROMETHAZINE HCL 25 MG/ML IJ SOLN: 6.2500 mg | INTRAMUSCULAR | Status: DC | PRN

## 2014-01-13 MED ORDER — BUPIVACAINE-EPINEPHRINE (PF) 0.25% -1:200000 IJ SOLN
INTRAMUSCULAR | Status: AC
  Filled 2014-01-13: qty 30

## 2014-01-13 MED ORDER — LISINOPRIL 20 MG PO TABS
20.0000 mg | ORAL_TABLET | Freq: Every day | ORAL | Status: DC
  Administered 2014-01-13 – 2014-01-14 (×2): 20 mg via ORAL
  Filled 2014-01-13 (×2): qty 1

## 2014-01-13 MED ORDER — HYDROMORPHONE HCL 1 MG/ML IJ SOLN: 0.2500 mg | INTRAMUSCULAR | Status: DC | PRN

## 2014-01-13 MED ORDER — PROPOFOL 10 MG/ML IV BOLUS
INTRAVENOUS | Status: DC | PRN
  Administered 2014-01-13: 150 mg via INTRAVENOUS

## 2014-01-13 MED ORDER — ACETAMINOPHEN 325 MG PO TABS: 650.0000 mg | ORAL_TABLET | ORAL | Status: DC | PRN

## 2014-01-13 MED ORDER — FENTANYL CITRATE 0.05 MG/ML IJ SOLN
INTRAMUSCULAR | Status: DC | PRN
  Administered 2014-01-13 (×3): 50 ug via INTRAVENOUS
  Administered 2014-01-13: 100 ug via INTRAVENOUS

## 2014-01-13 MED ORDER — SODIUM CHLORIDE 0.9 % IJ SOLN
INTRAMUSCULAR | Status: AC
  Filled 2014-01-13: qty 10

## 2014-01-13 MED ORDER — LACTATED RINGERS IV SOLN
INTRAVENOUS | Status: DC | PRN
  Administered 2014-01-13: 09:00:00

## 2014-01-13 MED ORDER — ALBUTEROL SULFATE HFA 108 (90 BASE) MCG/ACT IN AERS
INHALATION_SPRAY | RESPIRATORY_TRACT | Status: DC | PRN
  Administered 2014-01-13 (×2): 2 via RESPIRATORY_TRACT

## 2014-01-13 MED ORDER — ONDANSETRON HCL 4 MG/2ML IJ SOLN
INTRAMUSCULAR | Status: AC
  Filled 2014-01-13: qty 2

## 2014-01-13 MED ORDER — CISATRACURIUM BESYLATE (PF) 10 MG/5ML IV SOLN
INTRAVENOUS | Status: DC | PRN
  Administered 2014-01-13 (×2): 2 mg via INTRAVENOUS

## 2014-01-13 MED ORDER — HYDROCHLOROTHIAZIDE 25 MG PO TABS
25.0000 mg | ORAL_TABLET | Freq: Every day | ORAL | Status: DC
  Administered 2014-01-13 – 2014-01-14 (×2): 25 mg via ORAL
  Filled 2014-01-13 (×2): qty 1

## 2014-01-13 MED ORDER — ONDANSETRON HCL 4 MG/2ML IJ SOLN
INTRAMUSCULAR | Status: DC | PRN
  Administered 2014-01-13: 4 mg via INTRAVENOUS

## 2014-01-13 MED ORDER — LACTATED RINGERS IV SOLN
INTRAVENOUS | Status: DC
  Administered 2014-01-13: 1000 mL via INTRAVENOUS
  Administered 2014-01-13: 10:00:00 via INTRAVENOUS

## 2014-01-13 MED ORDER — BUPIVACAINE-EPINEPHRINE 0.25% -1:200000 IJ SOLN
INTRAMUSCULAR | Status: DC | PRN
  Administered 2014-01-13: 30 mL

## 2014-01-13 MED ORDER — ALBUTEROL SULFATE HFA 108 (90 BASE) MCG/ACT IN AERS
INHALATION_SPRAY | RESPIRATORY_TRACT | Status: AC
  Filled 2014-01-13: qty 6.7

## 2014-01-13 MED ORDER — ROCURONIUM BROMIDE 100 MG/10ML IV SOLN
INTRAVENOUS | Status: AC
  Filled 2014-01-13: qty 1

## 2014-01-13 MED ORDER — CIPROFLOXACIN HCL 500 MG PO TABS: 500.0000 mg | ORAL_TABLET | Freq: Two times a day (BID) | ORAL | Status: DC

## 2014-01-13 MED ORDER — CEFAZOLIN SODIUM-DEXTROSE 2-3 GM-% IV SOLR
INTRAVENOUS | Status: AC
  Filled 2014-01-13: qty 50

## 2014-01-13 MED ORDER — HEPARIN SODIUM (PORCINE) 1000 UNIT/ML IJ SOLN
INTRAMUSCULAR | Status: AC
  Filled 2014-01-13: qty 1

## 2014-01-13 MED ORDER — KETOROLAC TROMETHAMINE 15 MG/ML IJ SOLN
15.0000 mg | Freq: Four times a day (QID) | INTRAMUSCULAR | Status: DC
  Administered 2014-01-13 – 2014-01-14 (×4): 15 mg via INTRAVENOUS
  Filled 2014-01-13 (×6): qty 1

## 2014-01-13 MED ORDER — DEXAMETHASONE SODIUM PHOSPHATE 10 MG/ML IJ SOLN
INTRAMUSCULAR | Status: DC | PRN
  Administered 2014-01-13: 10 mg via INTRAVENOUS

## 2014-01-13 MED ORDER — AMLODIPINE BESYLATE 10 MG PO TABS
10.0000 mg | ORAL_TABLET | Freq: Every morning | ORAL | Status: DC
  Administered 2014-01-14: 10 mg via ORAL
  Filled 2014-01-13: qty 1

## 2014-01-13 MED ORDER — SODIUM CHLORIDE 0.9 % IR SOLN
Status: DC | PRN
  Administered 2014-01-13: 1000 mL

## 2014-01-13 MED ORDER — INFLUENZA VAC SPLIT QUAD 0.5 ML IM SUSY
0.5000 mL | PREFILLED_SYRINGE | INTRAMUSCULAR | Status: AC
  Administered 2014-01-14: 0.5 mL via INTRAMUSCULAR
  Filled 2014-01-13 (×2): qty 0.5

## 2014-01-13 MED ORDER — LIDOCAINE HCL (CARDIAC) 20 MG/ML IV SOLN
INTRAVENOUS | Status: AC
Start: 2014-01-13 — End: 2014-01-13
  Filled 2014-01-13: qty 5

## 2014-01-13 MED ORDER — GLYCOPYRROLATE 0.2 MG/ML IJ SOLN
INTRAMUSCULAR | Status: AC
  Filled 2014-01-13: qty 4

## 2014-01-13 MED ORDER — ROCURONIUM BROMIDE 100 MG/10ML IV SOLN
INTRAVENOUS | Status: DC | PRN
  Administered 2014-01-13: 50 mg via INTRAVENOUS
  Administered 2014-01-13 (×2): 10 mg via INTRAVENOUS

## 2014-01-13 MED ORDER — HYDROMORPHONE HCL 1 MG/ML IJ SOLN
INTRAMUSCULAR | Status: DC | PRN
  Administered 2014-01-13: 0.5 mg via INTRAVENOUS
  Administered 2014-01-13: 1 mg via INTRAVENOUS

## 2014-01-13 MED ORDER — EPHEDRINE SULFATE 50 MG/ML IJ SOLN
INTRAMUSCULAR | Status: AC
  Filled 2014-01-13: qty 1

## 2014-01-13 MED ORDER — MIDAZOLAM HCL 5 MG/5ML IJ SOLN
INTRAMUSCULAR | Status: DC | PRN
  Administered 2014-01-13: 2 mg via INTRAVENOUS

## 2014-01-13 MED ORDER — HYDROMORPHONE HCL 2 MG/ML IJ SOLN
INTRAMUSCULAR | Status: AC
  Filled 2014-01-13: qty 1

## 2014-01-13 MED ORDER — CEFAZOLIN SODIUM-DEXTROSE 2-3 GM-% IV SOLR
2.0000 g | INTRAVENOUS | Status: AC
Start: 2014-01-13 — End: 2014-01-13
  Administered 2014-01-13: 2 g via INTRAVENOUS

## 2014-01-13 MED ORDER — LISINOPRIL-HYDROCHLOROTHIAZIDE 20-25 MG PO TABS: 1.0000 | ORAL_TABLET | Freq: Every morning | ORAL | Status: DC

## 2014-01-13 MED ORDER — STERILE WATER FOR IRRIGATION IR SOLN
Status: DC | PRN
  Administered 2014-01-13: 3000 mL

## 2014-01-13 MED ORDER — HYDROCODONE-ACETAMINOPHEN 5-325 MG PO TABS: 1.0000 | ORAL_TABLET | Freq: Four times a day (QID) | ORAL | Status: DC | PRN

## 2014-01-13 MED ORDER — DEXTROSE-NACL 5-0.45 % IV SOLN
INTRAVENOUS | Status: DC
  Administered 2014-01-13 – 2014-01-14 (×3): via INTRAVENOUS

## 2014-01-13 MED ORDER — MORPHINE SULFATE 2 MG/ML IJ SOLN
2.0000 mg | INTRAMUSCULAR | Status: DC | PRN
  Administered 2014-01-14: 2 mg via INTRAVENOUS
  Filled 2014-01-13: qty 1

## 2014-01-13 MED ORDER — GLYCOPYRROLATE 0.2 MG/ML IJ SOLN
INTRAMUSCULAR | Status: DC | PRN
  Administered 2014-01-13: .8 mg via INTRAVENOUS

## 2014-01-13 MED ORDER — GABAPENTIN 300 MG PO CAPS
300.0000 mg | ORAL_CAPSULE | Freq: Three times a day (TID) | ORAL | Status: DC
  Administered 2014-01-13 – 2014-01-14 (×3): 300 mg via ORAL
  Filled 2014-01-13 (×4): qty 1

## 2014-01-13 MED ORDER — NEOSTIGMINE METHYLSULFATE 10 MG/10ML IV SOLN
INTRAVENOUS | Status: AC
  Filled 2014-01-13: qty 1

## 2014-01-13 MED ORDER — HYDROCODONE-ACETAMINOPHEN 5-325 MG PO TABS
1.0000 | ORAL_TABLET | ORAL | Status: DC | PRN
  Administered 2014-01-14: 2 via ORAL
  Filled 2014-01-13: qty 2

## 2014-01-13 MED ORDER — KETOROLAC TROMETHAMINE 15 MG/ML IJ SOLN
INTRAMUSCULAR | Status: AC
  Filled 2014-01-13: qty 1

## 2014-01-13 MED ORDER — MIDAZOLAM HCL 2 MG/2ML IJ SOLN
INTRAMUSCULAR | Status: AC
  Filled 2014-01-13: qty 2

## 2014-01-13 MED ORDER — EPHEDRINE SULFATE 50 MG/ML IJ SOLN
INTRAMUSCULAR | Status: DC | PRN
  Administered 2014-01-13 (×4): 5 mg via INTRAVENOUS

## 2014-01-13 MED ORDER — ONDANSETRON HCL 4 MG/2ML IJ SOLN: 4.0000 mg | INTRAMUSCULAR | Status: DC | PRN

## 2014-01-13 MED ORDER — METOPROLOL SUCCINATE ER 100 MG PO TB24
100.0000 mg | ORAL_TABLET | Freq: Two times a day (BID) | ORAL | Status: DC
  Administered 2014-01-13 – 2014-01-14 (×2): 100 mg via ORAL
  Filled 2014-01-13 (×2): qty 1

## 2014-01-13 SURGICAL SUPPLY — 49 items
CABLE HIGH FREQUENCY MONO STRZ (ELECTRODE) ×3 IMPLANT
CATH FOLEY 2WAY SLVR 18FR 30CC (CATHETERS) ×3 IMPLANT
CATH ROBINSON RED A/P 16FR (CATHETERS) ×3 IMPLANT
CATH TIEMANN FOLEY 18FR 5CC (CATHETERS) ×3 IMPLANT
CHLORAPREP W/TINT 26ML (MISCELLANEOUS) ×3 IMPLANT
CLIP LIGATING HEM O LOK PURPLE (MISCELLANEOUS) IMPLANT
CLOTH BEACON ORANGE TIMEOUT ST (SAFETY) ×3 IMPLANT
COVER SURGICAL LIGHT HANDLE (MISCELLANEOUS) ×3 IMPLANT
COVER TIP SHEARS 8 DVNC (MISCELLANEOUS) ×2 IMPLANT
COVER TIP SHEARS 8MM DA VINCI (MISCELLANEOUS) ×1
CUTTER ECHEON FLEX ENDO 45 340 (ENDOMECHANICALS) ×3 IMPLANT
DECANTER SPIKE VIAL GLASS SM (MISCELLANEOUS) IMPLANT
DRAPE SURG IRRIG POUCH 19X23 (DRAPES) ×3 IMPLANT
DRSG TEGADERM 2-3/8X2-3/4 SM (GAUZE/BANDAGES/DRESSINGS) ×12 IMPLANT
DRSG TEGADERM 4X4.75 (GAUZE/BANDAGES/DRESSINGS) ×6 IMPLANT
DRSG TEGADERM 6X8 (GAUZE/BANDAGES/DRESSINGS) ×6 IMPLANT
ELECT REM PT RETURN 9FT ADLT (ELECTROSURGICAL) ×3
ELECTRODE REM PT RTRN 9FT ADLT (ELECTROSURGICAL) ×2 IMPLANT
GAUZE SPONGE 2X2 8PLY STRL LF (GAUZE/BANDAGES/DRESSINGS) ×2 IMPLANT
GLOVE BIO SURGEON STRL SZ 6.5 (GLOVE) ×3 IMPLANT
GLOVE BIOGEL M STRL SZ7.5 (GLOVE) ×6 IMPLANT
GOWN STRL REUS W/TWL LRG LVL3 (GOWN DISPOSABLE) IMPLANT
HOLDER FOLEY CATH W/STRAP (MISCELLANEOUS) ×3 IMPLANT
IV LACTATED RINGERS 1000ML (IV SOLUTION) IMPLANT
KIT ACCESSORY DA VINCI DISP (KITS) ×1
KIT ACCESSORY DVNC DISP (KITS) ×2 IMPLANT
NDL SAFETY ECLIPSE 18X1.5 (NEEDLE) ×2 IMPLANT
NEEDLE HYPO 18GX1.5 SHARP (NEEDLE) ×1
PACK ROBOT UROLOGY CUSTOM (CUSTOM PROCEDURE TRAY) ×3 IMPLANT
PEN SKIN MARKING BROAD (MISCELLANEOUS) ×3 IMPLANT
RELOAD GREEN ECHELON 45 (STAPLE) ×3 IMPLANT
SEALER TISSUE G2 CVD JAW 45CM (ENDOMECHANICALS) IMPLANT
SET TUBE IRRIG SUCTION NO TIP (IRRIGATION / IRRIGATOR) ×3 IMPLANT
SOLUTION ELECTROLUBE (MISCELLANEOUS) ×3 IMPLANT
SPONGE GAUZE 2X2 STER 10/PKG (GAUZE/BANDAGES/DRESSINGS) ×1
STAPLER VISISTAT 35W (STAPLE) ×3 IMPLANT
SUT ETHILON 3 0 PS 1 (SUTURE) ×3 IMPLANT
SUT VIC AB 0 CT1 27 (SUTURE) ×1
SUT VIC AB 0 CT1 27XBRD ANTBC (SUTURE) ×2 IMPLANT
SUT VIC AB 0 UR5 27 (SUTURE) ×3 IMPLANT
SUT VIC AB 2-0 SH 27 (SUTURE) ×1
SUT VIC AB 2-0 SH 27X BRD (SUTURE) ×2 IMPLANT
SUT VICRYL 0 UR6 27IN ABS (SUTURE) ×6 IMPLANT
SUT VLOC BARB 180 ABS3/0GR12 (SUTURE) ×6
SUTURE VLOC BRB 180 ABS3/0GR12 (SUTURE) ×4 IMPLANT
SYR 27GX1/2 1ML LL SAFETY (SYRINGE) ×3 IMPLANT
TOWEL OR 17X26 10 PK STRL BLUE (TOWEL DISPOSABLE) IMPLANT
TOWEL OR NON WOVEN STRL DISP B (DISPOSABLE) ×3 IMPLANT
WATER STERILE IRR 1500ML POUR (IV SOLUTION) IMPLANT

## 2014-01-13 NOTE — H&P (Signed)
Reason For Visit    Rodney Maynard is a 58 year old male referred by Dr. 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 Patient was initially diagnosed with low risk disease in 2012 but now has evidence of pathologic progression.   SHIM 11/25  IPSS 12/3   History of Present Illness   Past urologic history from Dr.Ottelin:     Prostate cancer: Elevated PSA of 4.97 in 8/10. I recommend TRUS/BX at that time however he failed to return.  8/12 - PSA 5.7  9/12 TRUS/BX (done at [facility]) - 1 core Rt. base 20% 3+3=6   Treatment: Active surveillance   10/09/13 repeat TRUS/BX: PSA at the time was 7.80. His prostate volume measured51 cc.  Pathology: 6 cores positive for adenocarcinoma with 4 cores 3+3 = 6 and 2 cores 3+4 = 7.       LUTS: He reported nocturia greater than x3 and tells me that when he tried a friend's Flomax he noted complete resolution of his nocturia. He remains on Flomax now and says he only gets up about twice at night. He was taking Flomax but he stopped taking that and is taking something over-the-counter that he gets at the [facility] for $2.     Interval history: He reports no new urologic complaints today.     Past Medical History Problems  1. History of hypertension (V12.59)  Surgical History Problems  1. History of Biopsy Of The Prostate Needle 2. History of Biopsy Of The Prostate Needle 3. History of Ear Surgery 4. History of Elective Circumcision 5. History of Tonsillectomy  Current Meds 1. AmLODIPine Besylate 10 MG Oral Tablet;  Therapy: (Recorded:25Jul2014) to Recorded 2. Doxazosin Mesylate 4 MG Oral Tablet;  Therapy: (Recorded:25Jul2014) to Recorded 3. Gabapentin 300 MG Oral Capsule;  Therapy: (Recorded:25Jul2014) to Recorded 4. Ketoconazole 2 % External Cream;  Therapy: (Recorded:25Jul2014) to Recorded 5. Lisinopril-Hydrochlorothiazide 20-25 MG Oral  Tablet;  Therapy: (Recorded:25Jul2014) to Recorded 6. Metoprolol Tartrate 100 MG Oral Tablet;  Therapy: (Recorded:25Jul2014) to Recorded 7. Multi-Day TABS;  Therapy: (Recorded:25Jul2014) to Recorded 8. Omega-3 1000 MG Oral Capsule;  Therapy: (Recorded:25Jul2014) to Recorded  Allergies Medication  1. No Known Drug Allergies  Family History Problems  1. Family history of Death In The Family Father : Sister   deceased age 70 2. Family history of Death In The Family Mother : Sister   deceased age 46 3. Family history of Diabetes Mellitus (V18.0) : Sister 4. Family history of Family Health Status Number Of Children : Sister   4 sons 3 dau  Social History Problems  1. Denied: History of Alcohol Use 2. Caffeine Use   1-2 per day 3. Former smoker (V15.82)   quit 35+ years ago (2-3 ppd) 4. Marital History - Currently Married 5. Occupation:   Scientist, water quality 6. Tobacco Use (V15.82)   quit 35 yrs ago  Review of Systems  Genitourinary: urinary frequency, nocturia and erectile dysfunction.  Musculoskeletal: back pain and joint pain.    Vitals  Blood Pressure: 146 / 88 Temperature: 98.6 F Heart Rate: 67  Physical Exam Constitutional: Well nourished and well developed . No acute distress.  ENT:. The ears and nose are normal in appearance.  Neck: The appearance of the neck is normal and no neck mass is present.  Pulmonary: No respiratory distress and normal respiratory rhythm and effort.  Cardiovascular: Heart rate and rhythm are normal . No peripheral edema.  Abdomen: The abdomen is soft  and nontender. No masses are palpated. No CVA tenderness. No hernias are palpable. No hepatosplenomegaly noted.  Genitourinary: Examination of the penis demonstrates no discharge, no masses, no lesions and a normal meatus. The scrotum is without lesions. The right epididymis is palpably normal and non-tender. The left epididymis is palpably normal and non-tender. The right testis is non-tender  and without masses. The left testis is non-tender and without masses.  Skin: Normal skin turgor, no visible rash and no visible skin lesions.  Neuro/Psych:. Mood and affect are appropriate.    Assessment Assessed  1. Adenocarcinoma of prostate (185)  Plan Adenocarcinoma of prostate  1. Follow-up Schedule Surgery Office  Follow-up  Status: Complete  Done: [id number]  Discussion/Summary  The patient was counseled about the natural history of prostate cancer and the standard treatment options that are available for prostate cancer. It was explained to him how his age and life expectancy, clinical stage, Gleason score, and PSA affect his prognosis, the decision to proceed with additional staging studies, as well as how that information influences recommended treatment strategies. We discussed the roles for active surveillance, radiation therapy, surgical therapy, androgen deprivation, as well as ablative therapy options for the treatment of prostate cancer as appropriate to his individual cancer situation. We discussed the risks and benefits of these options with regard to their impact on cancer control and also in terms of potential adverse events, complications, and impact on quiality of life particularly related to urinary, bowel, and sexual function. The patient was encouraged to ask questions throughout the discussion today and all questions were answered to his stated satisfaction. In addition, the patient was provided with and/or directed to appropriate resources and literature for further education about prostate cancer and treatment options.   We discussed surgical therapy for prostate cancer including the different available surgical approaches. We discussed, in detail, the risks and expectations of surgery with regard to cancer control, urinary control, and erectile function as well as the expected postoperative recovery process. The risks, potential complications/adverse events of radical  prostatectomy as well as alternative options were explained to the patient.   We discussed surgical therapy for prostate cancer including the different available surgical approaches. We discussed, in detail, the risks and expectations of surgery with regard to cancer control, urinary control, and erectile function as well as the expected postoperative recovery process. Additional risks of surgery including but not limited to bleeding, infection, hernia formation, nerve damage, lymphocele formation, bowel/rectal injury potentially necessitating colostomy, damage to the urinary tract resulting in urine leakage, urethral stricture, and the cardiopulmonary risks such as myocardial infarction, stroke, death, venothromboembolism, etc. were explained. The risk of open surgical conversion for robotic/laparoscopic prostatectomy was also discussed. Patient will like to see proceed with robotic prostatectomy. He would be a candidate for bilateral nerve spare. I would recommend pelvic lymph node dissection. We will arrange surgery for hopefully sometime the next 3-6 weeks.

## 2014-01-13 NOTE — Op Note (Signed)
Preoperative diagnosis: Clinically localized adenocarcinoma of the prostate (clinical stage T1c)  Postoperative diagnosis: Clinically localized adenocarcinoma of the prostate (clinical stage T1c)  Procedure:  1. Robotic assisted laparoscopic radical prostatectomy (bilateral nerve sparing) 2. Bilateral robotic assisted laparoscopic pelvic lymphadenectomy  Surgeon: Rana Snare. M.D.  Resident: Milon Score, MD  Assistant: Clemetine Marker, PA  Anesthesia: General  Complications: None  EBL: See anesthesia  IVF:  See anesthesia  Specimens: 1. Prostate and seminal vesicles 2. Right pelvic lymph nodes 3. Left pelvic lymph nodes  Disposition of specimens: Pathology  Drains: 1. 20 Fr coude catheter 2. # 19 Blake pelvic drain  Indication: Rodney Maynard is a 58 y.o. year old patient with clinically localized prostate cancer.  After a thorough review of the management options for treatment of prostate cancer, he elected to proceed with surgical therapy and the above procedure(s).  We have discussed the potential benefits and risks of the procedure, side effects of the proposed treatment, the likelihood of the patient achieving the goals of the procedure, and any potential problems that might occur during the procedure or recuperation. Informed consent has been obtained.  Description of procedure:  The patient was taken to the operating room and a general anesthetic was administered. He was given preoperative antibiotics, placed in the dorsal lithotomy position, and prepped and draped in the usual sterile fashion. Next a preoperative timeout was performed. A urethral catheter was placed into the bladder and a site was selected near the umbilicus for placement of the camera port. This was placed using a standard open technique which allowed entry into the peritoneal cavity under direct vision and without difficulty. A 12 mm port was placed and a pneumoperitoneum established. The camera  was then used to inspect the abdomen and there was no evidence of any intra-abdominal injuries or other abnormalities. The remaining abdominal ports were then placed. 8 mm robotic ports were placed in the right lower quadrant, left lower quadrant, and far left lateral abdominal wall. A 5 mm port was placed in the right upper quadrant and a 12 mm port was placed in the right lateral abdominal wall for laparoscopic assistance. All ports were placed under direct vision without difficulty. The surgical robot was then docked.   Utilizing the cautery scissors, the bladder was reflected posteriorly allowing entry into the space of Retzius and identification of the endopelvic fascia and prostate. The periprostatic fat was then removed from the prostate allowing full exposure of the endopelvic fascia. The endopelvic fascia was then incised from the apex back to the base of the prostate bilaterally and the underlying levator muscle fibers were swept laterally off the prostate thereby isolating the dorsal venous complex. The dorsal vein was then stapled and divided with a 45 mm Flex Echelon stapler. Attention then turned to the bladder neck which was divided anteriorly thereby allowing entry into the bladder and exposure of the urethral catheter. The catheter balloon was deflated and the catheter was brought into the operative field and used to retract the prostate anteriorly. The posterior bladder neck was then examined and was divided allowing further dissection between the bladder and prostate posteriorly until the vasa deferentia and seminal vessels were identified. The vasa deferentia were isolated, divided, and lifted anteriorly. The seminal vesicles were dissected down to their tips with care to control the seminal vascular arterial blood supply. These structures were then lifted anteriorly and the space between Denonvillier's fascia and the anterior rectum was developed with a combination of sharp  and blunt  dissection. This isolated the vascular pedicles of the prostate.  The lateral prostatic fascia was then sharply incised allowing release of the neurovascular bundles bilaterally. The vascular pedicles of the prostate were then ligated with Weck clips between the prostate and neurovascular bundles and divided with sharp cold scissor dissection resulting in neurovascular bundle preservation. The neurovascular bundles were then separated off the apex of the prostate and urethra bilaterally.  The urethra was then sharply transected allowing the prostate specimen to be disarticulated. The pelvis was copiously irrigated and hemostasis was ensured. There was no evidence for rectal injury.  Attention then turned to the right pelvic sidewall. The fibrofatty tissue between the external iliac vein, confluence of the iliac vessels, hypogastric artery, and Cooper's ligament was dissected free from the pelvic sidewall with care to preserve the obturator nerve. Weck clips were used for lymphostasis and hemostasis. An identical procedure was performed on the contralateral side and the lymphatic packets were removed for permanent pathologic analysis.  Attention then turned to the urethral anastomosis. A 2-0 Vicryl slip knot was placed between Denonvillier's fascia, the posterior bladder neck, and the posterior urethra to reapproximate these structures. A double-armed 3-0 V-locksuture was then used to perform a 360 running tension-free anastomosis between the bladder neck and urethra. A new urethral catheter was then placed into the bladder and irrigated. There were no blood clots within the bladder and the anastomosis appeared to be watertight. A #19 Blake drain was then brought through the left lateral 8 mm port site and positioned appropriately within the pelvis. It was secured to the skin with a nylon suture. The surgical cart was then undocked. The right lateral 12 mm port site was closed at the fascial level with a 0  Vicryl suture placed laparoscopically. All remaining ports were then removed under direct vision. The prostate specimen was removed intact within the Endopouch retrieval bag via the periumbilical camera port site. This fascial opening was closed with two running 0 Vicryl sutures. 0.25% Marcaine was then injected into all port sites and all incisions were reapproximated at the skin level with staples The patient appeared to tolerate the procedure well and without complications. The patient was able to be extubated and transferred to the recovery unit in satisfactory condition.

## 2014-01-13 NOTE — Care Management Note (Signed)
    Page 1 of 1   01/13/2014     2:26:35 PM CARE MANAGEMENT NOTE 01/13/2014  Patient:  Rodney Maynard, Rodney Maynard   Account Number:  [id number]  Date Initiated:  01/13/2014  Documentation initiated by:  Dessa Phi  Subjective/Objective Assessment:   58 Y/O M ADMITTED W/PROSTATE CA.     Action/Plan:   FROM HOME.   Anticipated DC Date:  01/14/2014   Anticipated DC Plan:  [place]  CM consult      Choice offered to / List presented to:             Status of service:  In process, will continue to follow Medicare Important Message given?   (If response is "NO", the following Medicare IM given date fields will be blank) Date Medicare IM given:   Medicare IM given by:   Date Additional Medicare IM given:   Additional Medicare IM given by:    Discharge Disposition:    Per UR Regulation:  Reviewed for med. necessity/level of care/duration of stay  If discussed at [place] of Stay Meetings, dates discussed:    Comments:  01/13/14 Kathryn Linarez RN,BSN NCM [phone] S/P LAP RAD PROSTATECTOMY.NO ANTICIPATED D/C NEEDS.

## 2014-01-13 NOTE — Progress Notes (Signed)
Post-op note  Subjective: The patient is doing well.  No complaints.  Has not amb yet  Objective: Vital signs in last 24 hours: Temp:  [97.4 F (36.3 C)-98.1 F (36.7 C)] 97.4 F (36.3 C) (09/23 1355) Pulse Rate:  [61-84] 77 (09/23 1355) Resp:  [14-26] 14 (09/23 1355) BP: (121-141)/(59-83) 125/72 mmHg (09/23 1355) SpO2:  [97 %-100 %] 98 % (09/23 1355) Weight:  [102.286 kg (225 lb 8 oz)] 102.286 kg (225 lb 8 oz) (09/23 0656)  Intake/Output from previous day:   Intake/Output this shift: Total I/O In: 4000 [I.V.:3000; IV Piggyback:1000] Out: 80 [Urine:55; Drains:25]  Physical Exam:  General: Alert and oriented. Abdomen: Soft, Nondistended. Incisions: Clean and dry. Urine: yellow  Lab Results:  Recent Labs  01/11/14 1205 01/13/14 1320  HGB 12.6* 12.1*  HCT 37.3* 34.5*    Assessment/Plan: POD#0   1) Continue to monitor  2) DVT prophy, clears, IS, amb, pain control   LOS: 0 days   Aldon Hengst 01/13/2014, 4:26 PM

## 2014-01-13 NOTE — Interval H&P Note (Signed)
History and Physical Interval Note:  01/13/2014 8:15 AM  Rodney Maynard  has presented today for surgery, with the diagnosis of Prostate Cancer  The various methods of treatment have been discussed with the patient and family. After consideration of risks, benefits and other options for treatment, the patient has consented to  Procedure(s): ROBOTIC ASSISTED LAPAROSCOPIC RADICAL PROSTATECTOMY (N/A) LYMPHADENECTOMY (Bilateral) as a surgical intervention .  The patient's history has been reviewed, patient examined, no change in status, stable for surgery.  I have reviewed the patient's chart and labs.  Questions were answered to the patient's satisfaction.     Anderson Middlebrooks S

## 2014-01-13 NOTE — Anesthesia Postprocedure Evaluation (Signed)
  Anesthesia Post-op Note  Patient: Rodney Maynard  Procedure(s) Performed: Procedure(s) (LRB): ROBOTIC ASSISTED LAPAROSCOPIC RADICAL PROSTATECTOMY (N/A) LYMPHADENECTOMY (Bilateral)  Patient Location: PACU  Anesthesia Type: General  Level of Consciousness: awake and alert   Airway and Oxygen Therapy: Patient Spontanous Breathing  Post-op Pain: mild  Post-op Assessment: Post-op Vital signs reviewed, Patient's Cardiovascular Status Stable, Respiratory Function Stable, Patent Airway and No signs of Nausea or vomiting  Last Vitals:  Filed Vitals:   01/13/14 1355  BP: 125/72  Pulse: 77  Temp: 36.3 C  Resp: 14    Post-op Vital Signs: stable   Complications: No apparent anesthesia complications

## 2014-01-13 NOTE — Discharge Instructions (Signed)

## 2014-01-13 NOTE — Progress Notes (Signed)
Patient states he had a ?cyst outside his rectum area which popped and drained yesterday morning. The area is still sore. He will talk to Dr. Risa Grill about it in holding area.

## 2014-01-13 NOTE — Anesthesia Preprocedure Evaluation (Addendum)
Anesthesia Evaluation  Patient identified by MRN, date of birth, ID band Patient awake    Reviewed: Allergy & Precautions, H&P , NPO status , Patient's Chart, lab work & pertinent test results  Airway Mallampati: II TM Distance: >3 FB Neck ROM: Full    Dental  (+) Edentulous Upper, Partial Lower   Pulmonary former smoker,  breath sounds clear to auscultation  Pulmonary exam normal       Cardiovascular hypertension, Pt. on medications Rhythm:Regular Rate:Normal     Neuro/Psych negative neurological ROS  negative psych ROS   GI/Hepatic negative GI ROS, Neg liver ROS,   Endo/Other  negative endocrine ROS  Renal/GU negative Renal ROS  negative genitourinary   Musculoskeletal  (+) Arthritis -,   Abdominal (+) + obese,   Peds negative pediatric ROS (+)  Hematology negative hematology ROS (+)   Anesthesia Other Findings   Reproductive/Obstetrics negative OB ROS                          Anesthesia Physical Anesthesia Plan  ASA: II  Anesthesia Plan: General   Post-op Pain Management:    Induction: Intravenous  Airway Management Planned: Oral ETT  Additional Equipment:   Intra-op Plan:   Post-operative Plan: Extubation in OR  Informed Consent: I have reviewed the patients History and Physical, chart, labs and discussed the procedure including the risks, benefits and alternatives for the proposed anesthesia with the patient or authorized representative who has indicated his/her understanding and acceptance.   Dental advisory given  Plan Discussed with: CRNA  Anesthesia Plan Comments:         Anesthesia Quick Evaluation

## 2014-01-13 NOTE — Transfer of Care (Signed)
Immediate Anesthesia Transfer of Care Note  Patient: Rodney Maynard  Procedure(s) Performed: Procedure(s) (LRB): ROBOTIC ASSISTED LAPAROSCOPIC RADICAL PROSTATECTOMY (N/A) LYMPHADENECTOMY (Bilateral)  Patient Location: PACU  Anesthesia Type: General  Level of Consciousness: sedated, patient cooperative and responds to stimulation  Airway & Oxygen Therapy: Patient Spontanous Breathing and Patient connected to face mask oxgen  Post-op Assessment: Report given to PACU RN and Post -op Vital signs reviewed and stable  Post vital signs: Reviewed and stable  Complications: No apparent anesthesia complications

## 2014-01-14 LAB — BASIC METABOLIC PANEL
Anion gap: 12 (ref 5–15)
BUN: 12 mg/dL (ref 6–23)
CO2: 25 mEq/L (ref 19–32)
Calcium: 8.6 mg/dL (ref 8.4–10.5)
Chloride: 98 mEq/L (ref 96–112)
Creatinine, Ser: 0.99 mg/dL (ref 0.50–1.35)
GFR calc Af Amer: >90 mL/min (ref >90)
GFR calc non Af Amer: 87 mL/min — ABNORMAL LOW (ref >90)
GLUCOSE: 141 mg/dL — AB (ref 70–99)
Potassium: 4 mEq/L (ref 3.7–5.3)
SODIUM: 135 meq/L — AB (ref 137–147)

## 2014-01-14 LAB — HEMOGLOBIN AND HEMATOCRIT, BLOOD
HCT: 33 % — ABNORMAL LOW (ref 39.0–52.0)
HEMOGLOBIN: 11.5 g/dL — AB (ref 13.0–17.0)

## 2014-01-14 MED ORDER — BISACODYL 10 MG RE SUPP
10.0000 mg | Freq: Once | RECTAL | Status: AC
  Administered 2014-01-14: 10 mg via RECTAL
  Filled 2014-01-14: qty 1

## 2014-01-14 NOTE — Progress Notes (Signed)
1 Day Post-Op Subjective: Patient reports pain control adequate. Mild soreness. Has ambulated well.  Objective: Vital signs in last 24 hours: Temp:  [97.4 F (36.3 C)-98.7 F (37.1 C)] 98.7 F (37.1 C) (09/24 0506) Pulse Rate:  [73-85] 73 (09/24 0506) Resp:  [14-26] 20 (09/24 0506) BP: (121-143)/(59-85) 139/78 mmHg (09/24 0506) SpO2:  [97 %-100 %] 99 % (09/24 0506)  Intake/Output from previous day: 09/23 0701 - 09/24 0700 In: 6280 [P.O.:780; I.V.:4500; IV Piggyback:1000] Out: 2980 [Urine:2880; Drains:100] Intake/Output this shift:    Physical Exam:  General:alert and no distress Cardiovascular: rrr Lungs:nl effort  GI: not done and soft Incisions: dry Urine:clear Extremities:NE/tenderness  Lab Results:  Recent Labs  01/11/14 1205 01/13/14 1320 01/14/14 0503  HGB 12.6* 12.1* 11.5*  HCT 37.3* 34.5* 33.0*   BMET  Recent Labs  01/11/14 1205 01/14/14 0503  NA 137 135*  K 4.1 4.0  CL 99 98  CO2 27 25  GLUCOSE 100* 141*  BUN 14 12  CREATININE 0.98 0.99  CALCIUM 9.8 8.6   No results found for this basename: LABPT, INR,  in the last 72 hours No results found for this basename: LABURIN,  in the last 72 hours No results found for this or any previous visit.  Studies/Results: No results found.  Assessment/Plan: 1 Day Post-Op, Procedure(s) (LRB): ROBOTIC ASSISTED LAPAROSCOPIC RADICAL PROSTATECTOMY (N/A) LYMPHADENECTOMY (Bilateral)  Ambulate, Incentive spirometry D/C pelvic drain Advance diet Home this am/afternoon   LOS: 1 day   Dinah Lupa S 01/14/2014, 8:06 AM

## 2014-01-14 NOTE — Progress Notes (Signed)
Pt d/c instructions reviewed w/ pt. Pt verbalizes understanding, all questions answered. Pt demonstrated capable care of foley/leg bag and verbalizes understanding w/ teach back. Pt d/c in stable condition to family's car in w/c by NT. Pt in possession of d/c instructions, scripts, and all personal belongings.

## 2014-01-14 NOTE — Discharge Summary (Signed)
  Date of admission: 01/13/2014  Date of discharge: 01/14/2014  Admission diagnosis: Prostate Cancer  Discharge diagnosis: Prostate Cancer  History and Physical: For full details, please see admission history and physical. Briefly, Rodney Maynard is a 58 y.o. gentleman with localized prostate cancer.  After discussing management/treatment options, he elected to proceed with surgical treatment.  Hospital Course: TAL KEMPKER was taken to the operating room on 01/13/2014 and underwent a robotic assisted laparoscopic radical prostatectomy. He tolerated this procedure well and without complications. Postoperatively, he was able to be transferred to a regular hospital room following recovery from anesthesia.  He was able to begin ambulating the night of surgery. He remained hemodynamically stable overnight.  He had excellent urine output with appropriately minimal output from his pelvic drain and his pelvic drain was removed on POD #1.  He was transitioned to oral pain medication, tolerated a clear liquid diet, and had met all discharge criteria and was able to be discharged home later on POD#1.  Laboratory values:  Recent Labs  01/11/14 1205 01/13/14 1320 01/14/14 0503  HGB 12.6* 12.1* 11.5*  HCT 37.3* 34.5* 33.0*    Disposition: Home  Discharge instruction: He was instructed to be ambulatory but to refrain from heavy lifting, strenuous activity, or driving. He was instructed on urethral catheter care.  Discharge medications:     Medication List    STOP taking these medications       fish oil-omega-3 fatty acids 1000 MG capsule     multivitamin with minerals tablet     tamsulosin 0.4 MG Caps capsule  Commonly known as:  FLOMAX      TAKE these medications       amLODipine 10 MG tablet  Commonly known as:  NORVASC  Take 10 mg by mouth every morning.     ciprofloxacin 500 MG tablet  Commonly known as:  CIPRO  Take 1 tablet (500 mg total) by mouth 2 (two) times daily.  Start day prior to office visit for foley removal     gabapentin 300 MG capsule  Commonly known as:  NEURONTIN  Take 300 mg by mouth 3 (three) times daily.     HYDROcodone-acetaminophen 5-325 MG per tablet  Commonly known as:  NORCO  Take 1-2 tablets by mouth every 6 (six) hours as needed.     lisinopril-hydrochlorothiazide 20-25 MG per tablet  Commonly known as:  PRINZIDE,ZESTORETIC  Take 1 tablet by mouth every morning.     metoprolol 200 MG 24 hr tablet  Commonly known as:  TOPROL-XL  Take 100 mg by mouth 2 (two) times daily.        Followup: He will followup in 1 week for catheter removal and to discuss his surgical pathology results.

## 2014-01-15 ENCOUNTER — Encounter (HOSPITAL_COMMUNITY): Payer: Self-pay | Admitting: Urology

## 2014-07-21 ENCOUNTER — Encounter (HOSPITAL_COMMUNITY): Payer: Self-pay | Admitting: *Deleted

## 2014-07-21 ENCOUNTER — Emergency Department (HOSPITAL_COMMUNITY)
Admission: EM | Admit: 2014-07-21 | Discharge: 2014-07-22 | Disposition: A | Discharge status: Home / Self Care | Payer: PPO | Attending: Emergency Medicine | Admitting: Emergency Medicine

## 2014-07-21 DIAGNOSIS — Z8546 Personal history of malignant neoplasm of prostate: Secondary | ICD-10-CM | POA: Diagnosis not present

## 2014-07-21 DIAGNOSIS — M1990 Unspecified osteoarthritis, unspecified site: Secondary | ICD-10-CM | POA: Diagnosis not present

## 2014-07-21 DIAGNOSIS — S0003XA Contusion of scalp, initial encounter: Secondary | ICD-10-CM | POA: Diagnosis not present

## 2014-07-21 DIAGNOSIS — Y9301 Activity, walking, marching and hiking: Secondary | ICD-10-CM | POA: Diagnosis not present

## 2014-07-21 DIAGNOSIS — S0990XA Unspecified injury of head, initial encounter: Secondary | ICD-10-CM | POA: Diagnosis present

## 2014-07-21 DIAGNOSIS — W010XXA Fall on same level from slipping, tripping and stumbling without subsequent striking against object, initial encounter: Secondary | ICD-10-CM | POA: Insufficient documentation

## 2014-07-21 DIAGNOSIS — Y929 Unspecified place or not applicable: Secondary | ICD-10-CM | POA: Insufficient documentation

## 2014-07-21 DIAGNOSIS — Z8639 Personal history of other endocrine, nutritional and metabolic disease: Secondary | ICD-10-CM | POA: Insufficient documentation

## 2014-07-21 DIAGNOSIS — Y998 Other external cause status: Secondary | ICD-10-CM | POA: Insufficient documentation

## 2014-07-21 DIAGNOSIS — I10 Essential (primary) hypertension: Secondary | ICD-10-CM | POA: Diagnosis not present

## 2014-07-21 DIAGNOSIS — S199XXA Unspecified injury of neck, initial encounter: Secondary | ICD-10-CM | POA: Insufficient documentation

## 2014-07-21 DIAGNOSIS — Z79899 Other long term (current) drug therapy: Secondary | ICD-10-CM | POA: Insufficient documentation

## 2014-07-21 DIAGNOSIS — S0083XA Contusion of other part of head, initial encounter: Secondary | ICD-10-CM

## 2014-07-21 DIAGNOSIS — W19XXXA Unspecified fall, initial encounter: Secondary | ICD-10-CM

## 2014-07-21 DIAGNOSIS — Z87891 Personal history of nicotine dependence: Secondary | ICD-10-CM | POA: Diagnosis not present

## 2014-07-21 NOTE — ED Notes (Signed)
Pt states that he tripped and fell onto concrete. Denies LOC. States that he takes baby aspirin a few days a week. Pt has knot to back of head.

## 2014-07-22 ENCOUNTER — Emergency Department (HOSPITAL_COMMUNITY): Payer: PPO

## 2014-07-22 MED ORDER — ACETAMINOPHEN 325 MG PO TABS
650.0000 mg | ORAL_TABLET | Freq: Once | ORAL | Status: AC
  Administered 2014-07-22: 650 mg via ORAL
  Filled 2014-07-22: qty 2

## 2014-07-22 NOTE — Discharge Instructions (Signed)

## 2014-07-22 NOTE — ED Provider Notes (Signed)
CSN: [id number]     Arrival date & time 07/21/14  2344 History  This chart was scribed for Ernestina Patches, MD by Rayfield Citizen, ED Scribe. This patient was seen in room D30C/D30C and the patient's care was started at 1:42 AM.    Chief Complaint  Patient presents with  . Fall  . Head Laceration   Patient is a 59 y.o. male presenting with fall and scalp laceration. The history is provided by the patient. No language interpreter was used.  Fall Associated symptoms include headaches. Pertinent negatives include no chest pain, no abdominal pain and no shortness of breath.  Head Laceration Associated symptoms include headaches. Pertinent negatives include no chest pain, no abdominal pain and no shortness of breath.     HPI Comments: SAVVAS ROPER is a 59 y.o. male with past medical history of HTN, HLD who presents to the Emergency Department complaining of headache and knot in the occipital area after a mechanical fall around 23:00 tonight. Patient explains he was attempting to walk backwards when he tripped and fell backwards onto concrete. He reports mild neck pain at present. He denies LOC, nausea, vomiting. He denies any other concerns at this time.   He takes baby aspirin twice per week; last doses 3/29 and 07/21/14. Last tetanus in 2015.   Past Medical History  Diagnosis Date  . Hypertension   . High cholesterol   . Abnormal prostate biopsy 2012  . Cancer     prostate cancer, bx. 7'15, 2012 bx- was obseved until PSA showed steady increase.  . Arthritis     arthritis back-fingers, disc disease degenerative  with sciatic  pain both legs right > left.   Past Surgical History  Procedure Laterality Date  . Circumcision  1980  . Inner ear surgery Right   . Tonsillectomy    . Prostate biopsy    . Robot assisted laparoscopic radical prostatectomy N/A 01/13/2014    Procedure: ROBOTIC ASSISTED LAPAROSCOPIC RADICAL PROSTATECTOMY;  Surgeon: Bernestine Amass, MD;  Location: [facility];  Service:  Urology;  Laterality: N/A;  . Lymphadenectomy Bilateral 01/13/2014    Procedure: LYMPHADENECTOMY;  Surgeon: Bernestine Amass, MD;  Location: [facility];  Service: Urology;  Laterality: Bilateral;   No family history on file. History  Substance Use Topics  . Smoking status: Former Smoker    Quit date: 01/11/1974  . Smokeless tobacco: Not on file  . Alcohol Use: No    Review of Systems  Constitutional: Negative for fever, activity change, appetite change and fatigue.  HENT: Negative for congestion, facial swelling, rhinorrhea and trouble swallowing.   Eyes: Negative for photophobia and pain.  Respiratory: Negative for cough, chest tightness and shortness of breath.   Cardiovascular: Negative for chest pain and leg swelling.  Gastrointestinal: Negative for nausea, vomiting, abdominal pain, diarrhea and constipation.  Endocrine: Negative for polydipsia and polyuria.  Genitourinary: Negative for dysuria, urgency, decreased urine volume and difficulty urinating.  Musculoskeletal: Positive for neck pain. Negative for back pain and gait problem.  Skin: Positive for wound. Negative for color change and rash.  Allergic/Immunologic: Negative for immunocompromised state.  Neurological: Positive for headaches. Negative for dizziness, syncope, facial asymmetry, speech difficulty, weakness and numbness.  Psychiatric/Behavioral: Negative for confusion, decreased concentration and agitation.     Allergies  Review of patient's allergies indicates no known allergies.  Home Medications   Prior to Admission medications   Medication Sig Start Date End Date Taking? Authorizing Provider  amLODipine (NORVASC) 10 MG tablet  Take 10 mg by mouth every morning.    Historical Provider, MD  ciprofloxacin (CIPRO) 500 MG tablet Take 1 tablet (500 mg total) by mouth 2 (two) times daily. Start day prior to office visit for foley removal 01/13/14   Debbrah Alar, PA-C  gabapentin (NEURONTIN) 300 MG capsule Take 300 mg by  mouth 3 (three) times daily.    Historical Provider, MD  HYDROcodone-acetaminophen (NORCO) 5-325 MG per tablet Take 1-2 tablets by mouth every 6 (six) hours as needed. 01/13/14   Debbrah Alar, PA-C  lisinopril-hydrochlorothiazide (PRINZIDE,ZESTORETIC) 20-25 MG per tablet Take 1 tablet by mouth every morning.     Historical Provider, MD  metoprolol (TOPROL-XL) 200 MG 24 hr tablet Take 100 mg by mouth 2 (two) times daily.    Historical Provider, MD   BP 123/71 mmHg  Pulse 67  Temp(Src) 98.7 F (37.1 C) (Oral)  Resp 18  Ht 5\' 6"  (1.676 m)  Wt 230 lb (104.327 kg)  BMI 37.14 kg/m2  SpO2 96% Physical Exam  Constitutional: He is oriented to person, place, and time. He appears well-developed and well-nourished. No distress.  HENT:  Head: Normocephalic and atraumatic.  Mouth/Throat: No oropharyngeal exudate.  Eyes: Pupils are equal, round, and reactive to light.  6 x 6cm occipital contusion and abrasion   Neck: Normal range of motion. Neck supple.  Midline cervical pain  Cardiovascular: Normal rate, regular rhythm and normal heart sounds.  Exam reveals no gallop and no friction rub.   No murmur heard. Pulmonary/Chest: Effort normal and breath sounds normal. No respiratory distress. He has no wheezes. He has no rales.  Abdominal: Soft. Bowel sounds are normal. He exhibits no distension and no mass. There is no tenderness. There is no rebound and no guarding.  Musculoskeletal: Normal range of motion. He exhibits no edema or tenderness.  Neurological: He is alert and oriented to person, place, and time.  Skin: Skin is warm and dry.  Psychiatric: He has a normal mood and affect.    ED Course  Procedures   DIAGNOSTIC STUDIES: Oxygen Saturation is 97% on RA, adequate by my interpretation.    COORDINATION OF CARE: 1:48 AM Discussed treatment plan with pt at bedside and pt agreed to plan.   Labs Review  Labs Reviewed - No data to display  Imaging Review Ct Head Wo Contrast  07/22/2014    CLINICAL DATA:  Slipped on rock, and fell on the back of the head. Head laceration, with headache and neck pain. Initial encounter.  EXAM: CT HEAD WITHOUT CONTRAST  CT CERVICAL SPINE WITHOUT CONTRAST  TECHNIQUE: Multidetector CT imaging of the head and cervical spine was performed following the standard protocol without intravenous contrast. Multiplanar CT image reconstructions of the cervical spine were also generated.  COMPARISON:  Cervical spine radiographs performed 06/12/2013  FINDINGS: CT HEAD FINDINGS  There is no evidence of acute infarction, mass lesion, or intra- or extra-axial hemorrhage on CT.  Scattered periventricular white matter change likely reflects small vessel ischemic microangiopathy.  The posterior fossa, including the cerebellum, brainstem and fourth ventricle, is within normal limits. The third and lateral ventricles, and basal ganglia are unremarkable in appearance. The cerebral hemispheres are symmetric in appearance, with normal gray-white differentiation. No mass effect or midline shift is seen.  There is no evidence of fracture; visualized osseous structures are unremarkable in appearance. The orbits are within normal limits. There is partial opacification of the left mastoid air cells, left frontal sinus and sphenoid sinus. The patient is  status post right-sided mastoidectomy. The remaining paranasal sinuses are well-aerated. No significant soft tissue abnormalities are seen.  CT CERVICAL SPINE FINDINGS  There is no evidence of acute fracture or subluxation. There is minimal grade 1 anterolisthesis of C3 on C4, reflecting underlying facet disease. Multilevel disc space narrowing is noted along the cervical spine, with anterior and posterior disc osteophyte complexes. Vertebral bodies demonstrate normal height. Prevertebral soft tissues are within normal limits.  The thyroid gland is unremarkable in appearance. The visualized lung apices are clear. No significant soft tissue  abnormalities are seen.  IMPRESSION: 1. No evidence of traumatic intracranial injury or fracture. 2. No evidence of acute fracture or subluxation along the cervical spine. 3. Scattered small vessel ischemic microangiopathy. 4. Mild diffuse degenerative change along the cervical spine. 5. Partial opacification of the left mastoid air cells, left frontal sinus and sphenoid sinus.   Electronically Signed   By: Garald Balding M.D.   On: 07/22/2014 02:43   Ct Cervical Spine Wo Contrast  07/22/2014   CLINICAL DATA:  Slipped on rock, and fell on the back of the head. Head laceration, with headache and neck pain. Initial encounter.  EXAM: CT HEAD WITHOUT CONTRAST  CT CERVICAL SPINE WITHOUT CONTRAST  TECHNIQUE: Multidetector CT imaging of the head and cervical spine was performed following the standard protocol without intravenous contrast. Multiplanar CT image reconstructions of the cervical spine were also generated.  COMPARISON:  Cervical spine radiographs performed 06/12/2013  FINDINGS: CT HEAD FINDINGS  There is no evidence of acute infarction, mass lesion, or intra- or extra-axial hemorrhage on CT.  Scattered periventricular white matter change likely reflects small vessel ischemic microangiopathy.  The posterior fossa, including the cerebellum, brainstem and fourth ventricle, is within normal limits. The third and lateral ventricles, and basal ganglia are unremarkable in appearance. The cerebral hemispheres are symmetric in appearance, with normal gray-white differentiation. No mass effect or midline shift is seen.  There is no evidence of fracture; visualized osseous structures are unremarkable in appearance. The orbits are within normal limits. There is partial opacification of the left mastoid air cells, left frontal sinus and sphenoid sinus. The patient is status post right-sided mastoidectomy. The remaining paranasal sinuses are well-aerated. No significant soft tissue abnormalities are seen.  CT CERVICAL  SPINE FINDINGS  There is no evidence of acute fracture or subluxation. There is minimal grade 1 anterolisthesis of C3 on C4, reflecting underlying facet disease. Multilevel disc space narrowing is noted along the cervical spine, with anterior and posterior disc osteophyte complexes. Vertebral bodies demonstrate normal height. Prevertebral soft tissues are within normal limits.  The thyroid gland is unremarkable in appearance. The visualized lung apices are clear. No significant soft tissue abnormalities are seen.  IMPRESSION: 1. No evidence of traumatic intracranial injury or fracture. 2. No evidence of acute fracture or subluxation along the cervical spine. 3. Scattered small vessel ischemic microangiopathy. 4. Mild diffuse degenerative change along the cervical spine. 5. Partial opacification of the left mastoid air cells, left frontal sinus and sphenoid sinus.   Electronically Signed   By: Garald Balding M.D.   On: 07/22/2014 02:43     EKG Interpretation None     Using new [place] head trauma criteria, CT needed given presence of h/a.  MDM   Final diagnoses:  Fall  Hematoma of occipital surface of head, initial encounter    Pt is a 59 y.o. male with Pmhx as above who presents with h/a, neck pain after mechanical fall. CT head,  c-spine negative. Neuro exam unremarkable. CC is incorrect, pt does not have a head lac, but abrasion, tetanus UTD. Will d/c home     Rosario Jacks evaluation in the Emergency Department is complete. It has been determined that no acute conditions requiring further emergency intervention are present at this time. The patient/guardian have been advised of the diagnosis and plan. We have discussed signs and symptoms that warrant return to the ED, such as changes or worsening in symptoms, worsening h/a, numbness, weakness, confusion.    Medical screening examination/treatment/procedure(s) were performed by non-physician practitioner and as supervising physician I was  immediately available for consultation/collaboration.   EKG Interpretation None           Ernestina Patches, MD 07/22/14 2053

## 2014-07-22 NOTE — ED Notes (Signed)
Pt states that after he fell he was bleeding a little bit. Pt states that there were small spots of blood on a paper towel and that was it.

## 2015-05-27 ENCOUNTER — Ambulatory Visit
Admission: RE | Admit: 2015-05-27 | Discharge: 2015-05-27 | Disposition: A | Discharge status: Home / Self Care | Payer: PPO | Source: Ambulatory Visit | Attending: Internal Medicine | Admitting: Internal Medicine

## 2015-05-27 ENCOUNTER — Other Ambulatory Visit: Payer: Self-pay | Admitting: Internal Medicine

## 2015-05-27 DIAGNOSIS — M25551 Pain in right hip: Secondary | ICD-10-CM

## 2015-09-19 ENCOUNTER — Encounter (HOSPITAL_COMMUNITY): Payer: Self-pay

## 2015-09-19 ENCOUNTER — Emergency Department (HOSPITAL_COMMUNITY): Payer: PPO

## 2015-09-19 ENCOUNTER — Emergency Department (HOSPITAL_COMMUNITY)
Admission: EM | Admit: 2015-09-19 | Discharge: 2015-09-19 | Disposition: A | Discharge status: Home / Self Care | Payer: PPO | Attending: Emergency Medicine | Admitting: Emergency Medicine

## 2015-09-19 DIAGNOSIS — S8991XA Unspecified injury of right lower leg, initial encounter: Secondary | ICD-10-CM | POA: Diagnosis present

## 2015-09-19 DIAGNOSIS — Z8639 Personal history of other endocrine, nutritional and metabolic disease: Secondary | ICD-10-CM | POA: Diagnosis not present

## 2015-09-19 DIAGNOSIS — I10 Essential (primary) hypertension: Secondary | ICD-10-CM | POA: Diagnosis not present

## 2015-09-19 DIAGNOSIS — Z8546 Personal history of malignant neoplasm of prostate: Secondary | ICD-10-CM | POA: Diagnosis not present

## 2015-09-19 DIAGNOSIS — Z792 Long term (current) use of antibiotics: Secondary | ICD-10-CM | POA: Diagnosis not present

## 2015-09-19 DIAGNOSIS — S8001XA Contusion of right knee, initial encounter: Secondary | ICD-10-CM

## 2015-09-19 DIAGNOSIS — Z79899 Other long term (current) drug therapy: Secondary | ICD-10-CM | POA: Insufficient documentation

## 2015-09-19 DIAGNOSIS — Z87891 Personal history of nicotine dependence: Secondary | ICD-10-CM | POA: Diagnosis not present

## 2015-09-19 DIAGNOSIS — M130 Polyarthritis, unspecified: Secondary | ICD-10-CM | POA: Diagnosis not present

## 2015-09-19 DIAGNOSIS — Y998 Other external cause status: Secondary | ICD-10-CM | POA: Insufficient documentation

## 2015-09-19 DIAGNOSIS — W010XXA Fall on same level from slipping, tripping and stumbling without subsequent striking against object, initial encounter: Secondary | ICD-10-CM | POA: Diagnosis not present

## 2015-09-19 DIAGNOSIS — Y9289 Other specified places as the place of occurrence of the external cause: Secondary | ICD-10-CM | POA: Diagnosis not present

## 2015-09-19 DIAGNOSIS — S79911A Unspecified injury of right hip, initial encounter: Secondary | ICD-10-CM | POA: Diagnosis not present

## 2015-09-19 DIAGNOSIS — Y9354 Activity, bowling: Secondary | ICD-10-CM | POA: Diagnosis not present

## 2015-09-19 DIAGNOSIS — M25551 Pain in right hip: Secondary | ICD-10-CM

## 2015-09-19 MED ORDER — HYDROCODONE-ACETAMINOPHEN 5-325 MG PO TABS: 1.0000 | ORAL_TABLET | Freq: Four times a day (QID) | ORAL | Status: DC | PRN

## 2015-09-19 MED ORDER — IBUPROFEN 600 MG PO TABS: 600.0000 mg | ORAL_TABLET | Freq: Four times a day (QID) | ORAL | Status: DC | PRN

## 2015-09-19 NOTE — ED Provider Notes (Signed)
History  By signing my name below, I, Rodney Maynard, attest that this documentation has been prepared under the direction and in the presence of Rodney Maynard, [place]. Electronically Signed: Bea Maynard, ED Scribe. 09/19/2015. 9:04 PM.   Chief Complaint  Patient presents with  . Knee Injury    The history is provided by the patient and medical records. No language interpreter was used.     HPI Comments: Rodney Maynard is a 60 y.o. male with h/o arthritis who presents to the Emergency Department complaining of a right knee injury that occurred last night. He states he was bowling, slipped, landed on the right knee and then dropped the bowling ball on the right knee as well. He reports associated swelling of the right knee. He reports some right hip pain as well from the jarring motion of the fall. He has not taken anything for pain. Walking increases his pain. He denies alleviating factors. He denies head trauma, LOC, bruising, wounds, numbness, tingling or weakness of the LLE.   Past Medical History  Diagnosis Date  . Hypertension   . High cholesterol   . Abnormal prostate biopsy 2012  . Cancer [facility])     prostate cancer, bx. 7'15, 2012 bx- was obseved until PSA showed steady increase.  . Arthritis     arthritis back-fingers, disc disease degenerative  with sciatic  pain both legs right > left.   Past Surgical History  Procedure Laterality Date  . Circumcision  1980  . Inner ear surgery Right   . Tonsillectomy    . Prostate biopsy    . Robot assisted laparoscopic radical prostatectomy N/A 01/13/2014    Procedure: ROBOTIC ASSISTED LAPAROSCOPIC RADICAL PROSTATECTOMY;  Surgeon: Bernestine Amass, MD;  Location: [facility];  Service: Urology;  Laterality: N/A;  . Lymphadenectomy Bilateral 01/13/2014    Procedure: LYMPHADENECTOMY;  Surgeon: Bernestine Amass, MD;  Location: [facility];  Service: Urology;  Laterality: Bilateral;   No family history on file. Social History  Substance Use  Topics  . Smoking status: Former Smoker    Quit date: 01/11/1974  . Smokeless tobacco: None  . Alcohol Use: No    Review of Systems  Musculoskeletal: Positive for joint swelling and arthralgias.  Skin: Negative for color change and wound.  Neurological: Negative for syncope, weakness and numbness.  All other systems reviewed and are negative.  Allergies  Review of patient's allergies indicates no known allergies.  Home Medications   Prior to Admission medications   Medication Sig Start Date End Date Taking? Authorizing Provider  amLODipine (NORVASC) 10 MG tablet Take 10 mg by mouth every morning.    Historical Provider, MD  ciprofloxacin (CIPRO) 500 MG tablet Take 1 tablet (500 mg total) by mouth 2 (two) times daily. Start day prior to office visit for foley removal 01/13/14   Debbrah Alar, PA-C  gabapentin (NEURONTIN) 300 MG capsule Take 300 mg by mouth 3 (three) times daily.    Historical Provider, MD  HYDROcodone-acetaminophen (NORCO) 5-325 MG per tablet Take 1-2 tablets by mouth every 6 (six) hours as needed. 01/13/14   Debbrah Alar, PA-C  lisinopril-hydrochlorothiazide (PRINZIDE,ZESTORETIC) 20-25 MG per tablet Take 1 tablet by mouth every morning.     Historical Provider, MD  metoprolol (TOPROL-XL) 200 MG 24 hr tablet Take 100 mg by mouth 2 (two) times daily.    Historical Provider, MD   Triage Vitals: BP 135/90 mmHg  Pulse 74  Temp(Src) 99.1 F (37.3 C) (Oral)  Resp 16  SpO2 94% Physical Exam  Constitutional: He is oriented to person, place, and time. He appears well-developed and well-nourished.  HENT:  Head: Normocephalic and atraumatic.  Eyes: EOM are normal.  Neck: Normal range of motion.  Cardiovascular: Normal rate.   Pulmonary/Chest: Effort normal.  Musculoskeletal: He exhibits edema and tenderness.  Right knee with swelling and tenderness. Pain with movement. Right hip tender to palpation.  Neurological: He is alert and oriented to person, place, and time.   Skin: Skin is warm and dry.  Psychiatric: He has a normal mood and affect. His behavior is normal.  Nursing note and vitals reviewed.   ED Course  Procedures (including critical care time) DIAGNOSTIC STUDIES: Oxygen Saturation is 94% on RA, adequate by my interpretation.   COORDINATION OF CARE: 8:56 PM- Will X-Ray right hip. Pt verbalizes understanding and agrees to plan.  Medications - No data to display  Labs Review Labs Reviewed - No data to display  Imaging Review Dg Knee Complete 4 Views Right  09/19/2015  CLINICAL DATA:  Initial evaluation for acute knee pain status post fall yesterday. EXAM: RIGHT KNEE - COMPLETE 4+ VIEW COMPARISON:  None. FINDINGS: No acute fracture or dislocation. No joint effusion. Mild degenerative osteoarthrosis involving the medial lateral femoral joint space compartments as evidenced by subchondral sclerosis, joint space narrowing, and juxta-articular osteophytosis. Minimal degenerative spurring and intercondylar eminences. Probable mild chondrocalcinosis noted. Sclerotic lesion within the distal femoral shaft likely a benign bone island. Osseous mineralization normal. No acute soft tissue abnormality. IMPRESSION: 1. No acute osseous abnormality about the knee. 2. Mild degenerative osteoarthritic changes as above. Electronically Signed   By: Jeannine Boga M.D.   On: 09/19/2015 21:02   I have personally reviewed and evaluated these images and lab results as part of my medical decision-making.   EKG Interpretation None      MDM   Final diagnoses:  Contusion of right knee, initial encounter  Hip pain, right     I personally performed the services in this documentation, which was scribed in my presence.  The recorded information has been reviewed and considered.   Rodney Maynard.   Hollace Kinnier [place], PA-C 09/19/15 2146  Gareth Morgan, MD 09/21/15 1321

## 2015-09-19 NOTE — ED Notes (Signed)
Patient transported to X-ray 

## 2015-09-19 NOTE — Discharge Instructions (Signed)

## 2015-09-19 NOTE — ED Notes (Signed)
Pt states he was bowling yesterday, he slipped and fell and his bowling ball landed on his knee. No head injury, no LOC. He reports right knee pain and states it is swollen. Pt ambulatory with steady gait.

## 2015-09-19 NOTE — ED Notes (Signed)
Pt ambulatory to room TR05 with RN and steady gait noted

## 2015-11-02 IMAGING — CR DG CHEST 2V
2 series · 2 of 2 positions shown · non-contrast
Comparison: None.

CLINICAL DATA: Preop prostatectomy. Hypertension. Remote history of
smoking.

EXAM:
CHEST  2 VIEW

[w chest pa]
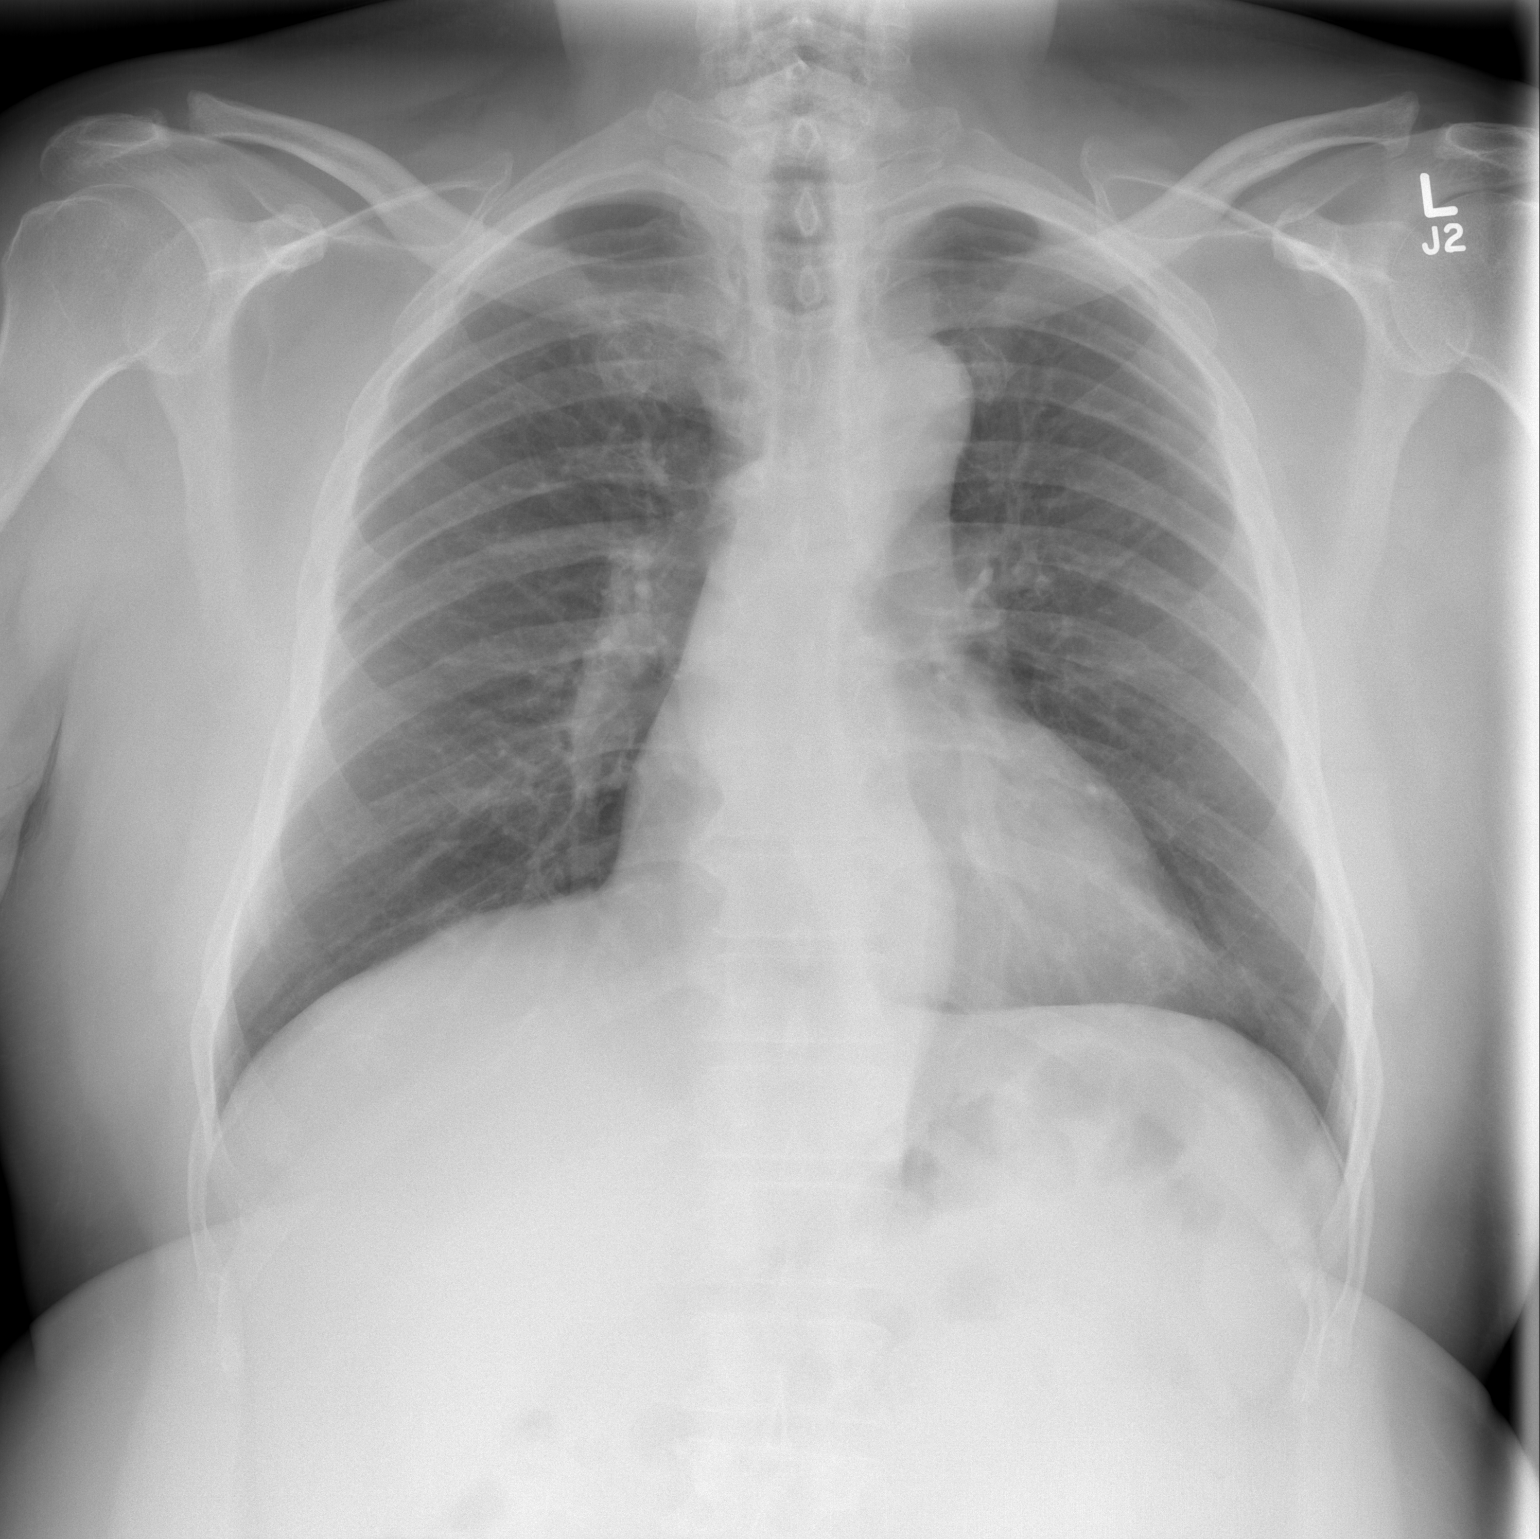

[w chest lat]
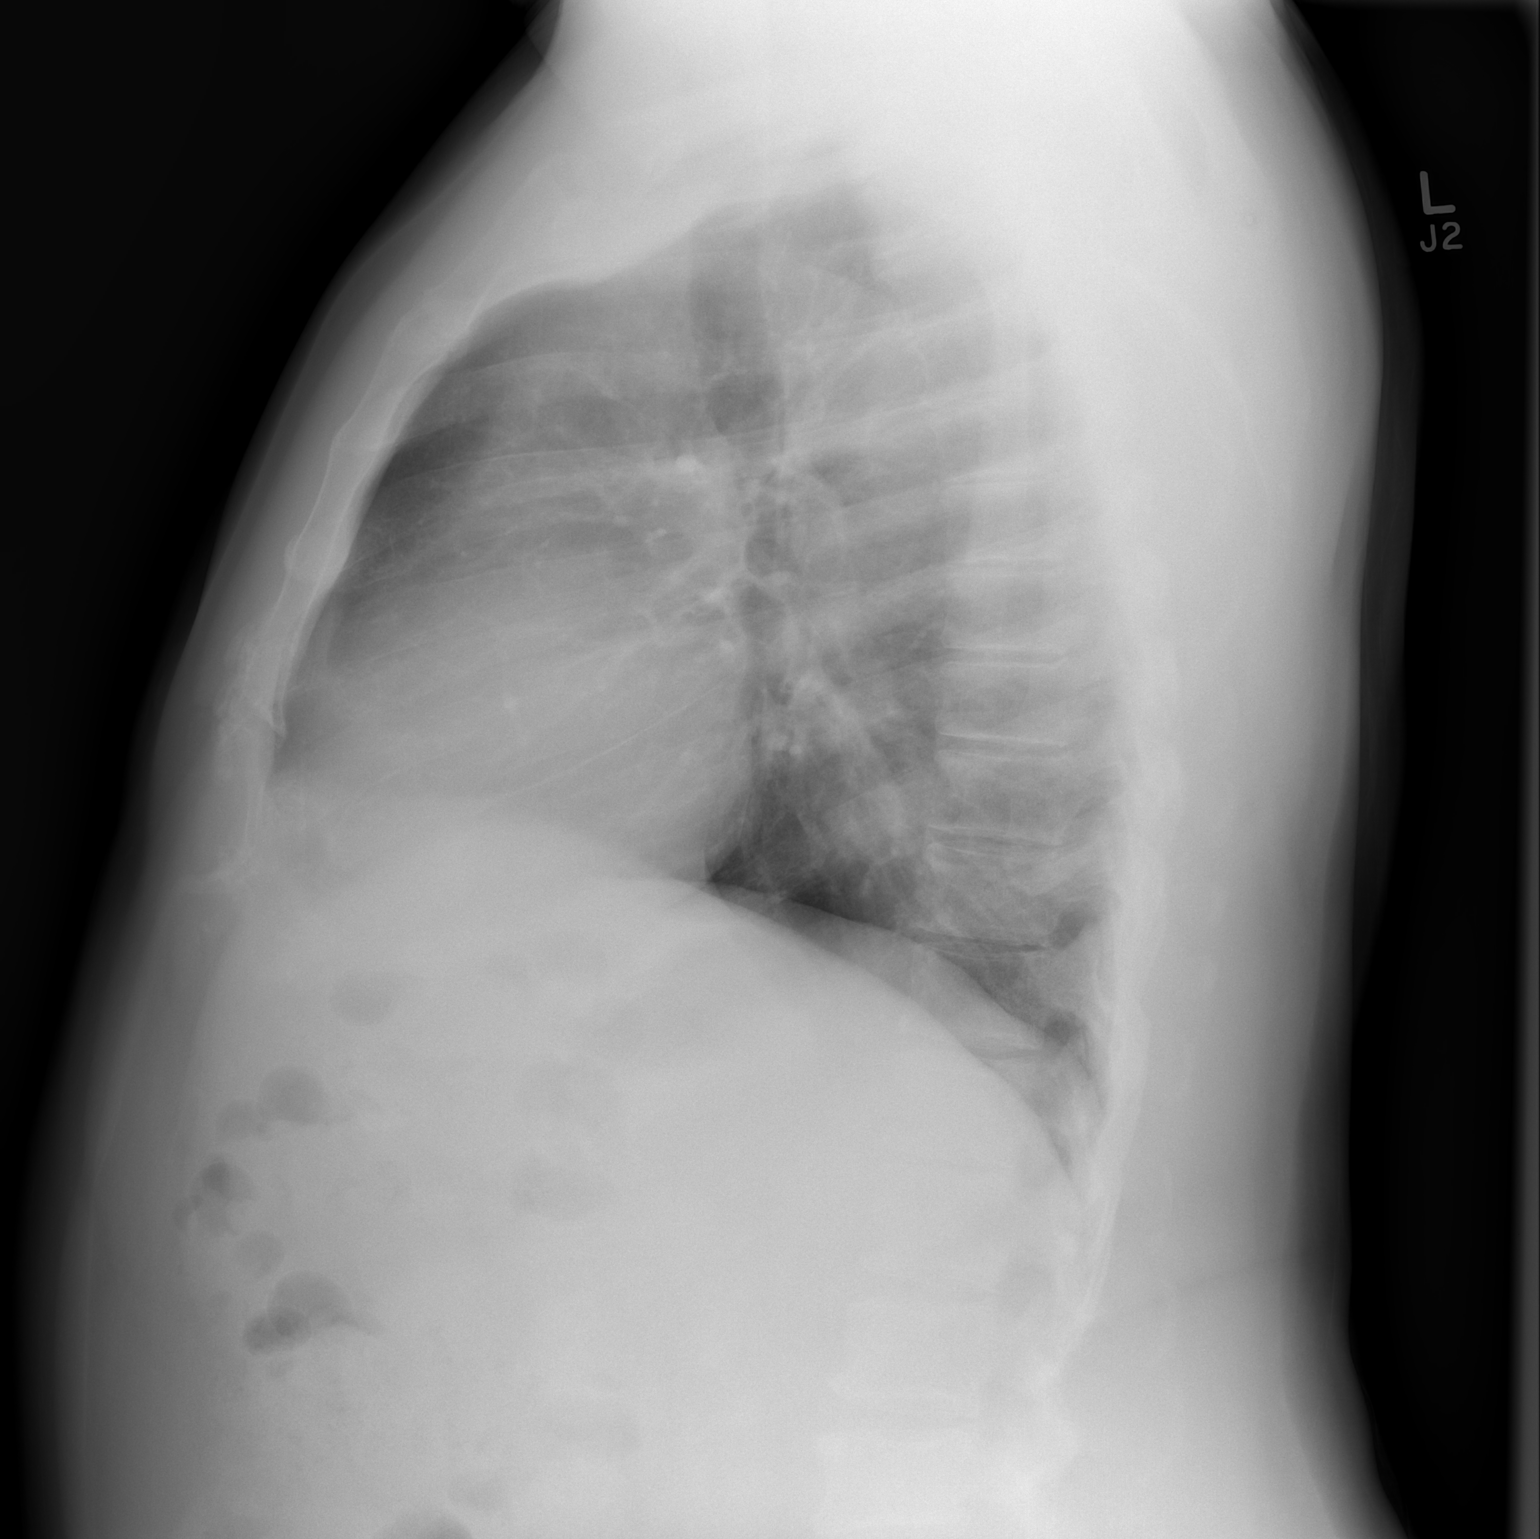

[2 of 2 positions shown; findings below may reference images not displayed]

FINDINGS: Tortuosity of the thoracic aorta. Heart is normal size. Lungs are
clear. No effusions or acute bony abnormality.
IMPRESSION: No active cardiopulmonary disease.

## 2016-07-02 DIAGNOSIS — M519 Unspecified thoracic, thoracolumbar and lumbosacral intervertebral disc disorder: Secondary | ICD-10-CM | POA: Diagnosis not present

## 2016-07-02 DIAGNOSIS — I10 Essential (primary) hypertension: Secondary | ICD-10-CM | POA: Diagnosis not present

## 2016-07-02 DIAGNOSIS — N529 Male erectile dysfunction, unspecified: Secondary | ICD-10-CM | POA: Diagnosis not present

## 2016-07-02 DIAGNOSIS — R7303 Prediabetes: Secondary | ICD-10-CM | POA: Diagnosis not present

## 2016-07-02 DIAGNOSIS — Z0000 Encounter for general adult medical examination without abnormal findings: Secondary | ICD-10-CM | POA: Diagnosis not present

## 2016-07-02 DIAGNOSIS — Z1159 Encounter for screening for other viral diseases: Secondary | ICD-10-CM | POA: Diagnosis not present

## 2016-07-02 DIAGNOSIS — Z1389 Encounter for screening for other disorder: Secondary | ICD-10-CM | POA: Diagnosis not present

## 2016-07-02 DIAGNOSIS — Z7189 Other specified counseling: Secondary | ICD-10-CM | POA: Diagnosis not present

## 2016-07-02 DIAGNOSIS — C61 Malignant neoplasm of prostate: Secondary | ICD-10-CM | POA: Diagnosis not present

## 2016-07-02 DIAGNOSIS — N400 Enlarged prostate without lower urinary tract symptoms: Secondary | ICD-10-CM | POA: Diagnosis not present

## 2016-07-02 DIAGNOSIS — M169 Osteoarthritis of hip, unspecified: Secondary | ICD-10-CM | POA: Diagnosis not present

## 2016-07-02 DIAGNOSIS — E782 Mixed hyperlipidemia: Secondary | ICD-10-CM | POA: Diagnosis not present

## 2016-08-02 DIAGNOSIS — J4521 Mild intermittent asthma with (acute) exacerbation: Secondary | ICD-10-CM | POA: Diagnosis not present

## 2016-08-23 DIAGNOSIS — R300 Dysuria: Secondary | ICD-10-CM | POA: Diagnosis not present

## 2016-08-23 DIAGNOSIS — R399 Unspecified symptoms and signs involving the genitourinary system: Secondary | ICD-10-CM | POA: Diagnosis not present

## 2016-08-23 DIAGNOSIS — C61 Malignant neoplasm of prostate: Secondary | ICD-10-CM | POA: Diagnosis not present

## 2016-08-28 DIAGNOSIS — H05243 Constant exophthalmos, bilateral: Secondary | ICD-10-CM | POA: Diagnosis not present

## 2016-08-28 DIAGNOSIS — H2513 Age-related nuclear cataract, bilateral: Secondary | ICD-10-CM | POA: Diagnosis not present

## 2016-08-28 DIAGNOSIS — H04123 Dry eye syndrome of bilateral lacrimal glands: Secondary | ICD-10-CM | POA: Diagnosis not present

## 2016-09-12 DIAGNOSIS — C61 Malignant neoplasm of prostate: Secondary | ICD-10-CM | POA: Diagnosis not present

## 2016-09-12 DIAGNOSIS — K432 Incisional hernia without obstruction or gangrene: Secondary | ICD-10-CM | POA: Diagnosis not present

## 2016-09-20 DIAGNOSIS — J069 Acute upper respiratory infection, unspecified: Secondary | ICD-10-CM | POA: Diagnosis not present

## 2016-09-20 DIAGNOSIS — R509 Fever, unspecified: Secondary | ICD-10-CM | POA: Diagnosis not present

## 2016-10-12 DIAGNOSIS — I10 Essential (primary) hypertension: Secondary | ICD-10-CM | POA: Diagnosis not present

## 2016-10-12 DIAGNOSIS — Z6832 Body mass index (BMI) 32.0-32.9, adult: Secondary | ICD-10-CM | POA: Diagnosis not present

## 2016-10-12 DIAGNOSIS — E669 Obesity, unspecified: Secondary | ICD-10-CM | POA: Diagnosis not present

## 2016-10-12 DIAGNOSIS — M5431 Sciatica, right side: Secondary | ICD-10-CM | POA: Diagnosis not present

## 2016-10-12 DIAGNOSIS — Z7982 Long term (current) use of aspirin: Secondary | ICD-10-CM | POA: Diagnosis not present

## 2016-10-12 DIAGNOSIS — Z0000 Encounter for general adult medical examination without abnormal findings: Secondary | ICD-10-CM | POA: Diagnosis not present

## 2016-10-12 DIAGNOSIS — Z972 Presence of dental prosthetic device (complete) (partial): Secondary | ICD-10-CM | POA: Diagnosis not present

## 2016-10-12 DIAGNOSIS — M25559 Pain in unspecified hip: Secondary | ICD-10-CM | POA: Diagnosis not present

## 2016-10-15 DIAGNOSIS — N281 Cyst of kidney, acquired: Secondary | ICD-10-CM | POA: Diagnosis not present

## 2016-10-15 DIAGNOSIS — K429 Umbilical hernia without obstruction or gangrene: Secondary | ICD-10-CM | POA: Diagnosis not present

## 2016-10-17 DIAGNOSIS — M25551 Pain in right hip: Secondary | ICD-10-CM | POA: Diagnosis not present

## 2016-10-17 DIAGNOSIS — K432 Incisional hernia without obstruction or gangrene: Secondary | ICD-10-CM | POA: Diagnosis not present

## 2016-11-06 DIAGNOSIS — Z01818 Encounter for other preprocedural examination: Secondary | ICD-10-CM | POA: Diagnosis not present

## 2016-11-09 DIAGNOSIS — M1611 Unilateral primary osteoarthritis, right hip: Secondary | ICD-10-CM | POA: Diagnosis not present

## 2016-11-09 DIAGNOSIS — M545 Low back pain: Secondary | ICD-10-CM | POA: Diagnosis not present

## 2016-11-09 DIAGNOSIS — M25551 Pain in right hip: Secondary | ICD-10-CM | POA: Diagnosis not present

## 2016-11-11 NOTE — Progress Notes (Deleted)
   Subjective:    Patient ID: Rodney Maynard, male    DOB: 1955/10/16, 61 y.o.   MRN: [id number]  HPI  Rodney Maynard is a 61 year old male who is a new patient to our office and presents today requesting an evaluation for surgical clearance so he can proceed with a robotic umbilical hernia repair that is scheduled for this Friday 7/27.  Patient's PCP is Dr. [name] who is with [facility] at [facility].   Those records are not available for my review.  His medical history is significant for hypertension and hyperlipidemia.  His most recent surgery was 3 years prior for robotic prostatectomy due to prostate cancer.  Past Medical History:  Diagnosis Date  . Abnormal prostate biopsy 2012  . Arthritis    arthritis back-fingers, disc disease degenerative  with sciatic  pain both legs right > left.  . Cancer [facility])    prostate cancer, bx. 7'15, 2012 bx- was obseved until PSA showed steady increase.  . High cholesterol   . Hypertension    Past Surgical History:  Procedure Laterality Date  . CIRCUMCISION  1980  . INNER EAR SURGERY Right   . LYMPHADENECTOMY Bilateral 01/13/2014   Procedure: LYMPHADENECTOMY;  Surgeon: Bernestine Amass, MD;  Location: [facility];  Service: Urology;  Laterality: Bilateral;  . PROSTATE BIOPSY    . ROBOT ASSISTED LAPAROSCOPIC RADICAL PROSTATECTOMY N/A 01/13/2014   Procedure: ROBOTIC ASSISTED LAPAROSCOPIC RADICAL PROSTATECTOMY;  Surgeon: Bernestine Amass, MD;  Location: [facility];  Service: Urology;  Laterality: N/A;  . TONSILLECTOMY     Current Outpatient Prescriptions on File Prior to Visit  Medication Sig Dispense Refill  . amLODipine (NORVASC) 10 MG tablet Take 10 mg by mouth every morning.    . ciprofloxacin (CIPRO) 500 MG tablet Take 1 tablet (500 mg total) by mouth 2 (two) times daily. Start day prior to office visit for foley removal (Patient not taking: Reported on 09/19/2015) 6 tablet 0  . gabapentin (NEURONTIN) 300 MG capsule Take 300 mg by mouth 3  (three) times daily.    Marland Kitchen HYDROcodone-acetaminophen (NORCO) 5-325 MG tablet Take 1-2 tablets by mouth every 6 (six) hours as needed. 16 tablet 0  . ibuprofen (ADVIL,MOTRIN) 600 MG tablet Take 1 tablet (600 mg total) by mouth every 6 (six) hours as needed. 20 tablet 0  . lisinopril-hydrochlorothiazide (PRINZIDE,ZESTORETIC) 20-25 MG per tablet Take 1 tablet by mouth every morning.     . metoprolol (TOPROL-XL) 200 MG 24 hr tablet Take 100 mg by mouth 2 (two) times daily.     No current facility-administered medications on file prior to visit.    No Known Allergies No family history on file. Social History   Social History  . Marital status: Married    Spouse name: N/A  . Number of children: N/A  . Years of education: N/A   Social History Main Topics  . Smoking status: Former Smoker    Quit date: 01/11/1974  . Smokeless tobacco: Not on file  . Alcohol use No  . Drug use: No  . Sexual activity: Yes   Other Topics Concern  . Not on file   Social History Narrative  . No narrative on file     Review of Systems     Objective:   Physical Exam        Assessment & Plan:

## 2016-11-11 NOTE — Progress Notes (Deleted)
Patient ID: Rodney Maynard, male   DOB: 1955/07/31, 61 y.o.   MRN: [id number]  Subjective:    Rodney Maynard is a 61 y.o. male who presents to the office today for a preoperative consultation at the request of surgeon Dr. Demetrius Revel who plans on performing a robotic umbilical hernia repair on July 27.  Patient's PCP is Dr. [name] who is with [facility] at [facility].   Those records are not available for my review.  This visit is Rodney Maynard visit with our office. This consultation is requested for the specific conditions prompting preoperative evaluation (i.e. because of potential affect on operative risk): general. Planned anesthesia: general. The patient has the following known anesthesia issues: {anesthesia problems:16687}. Patients bleeding risk: {bleeding risk:16688}. Patient {does/does not:19097} have objections to receiving blood products if needed.  The following portions of the patient's history were reviewed and updated as appropriate: allergies, current medications, past family history, past social history, past surgical history and problem list.  His medical history is significant for hypertension and hyperlipidemia.  His most recent surgery was 3 years prior for robotic prostatectomy due to prostate cancer. Review of Systems {ros; complete:30496}    Objective:    {exam; complete:17964}  Predictors of intubation difficulty:  Morbid obesity? {yes***/no:17258::"no"}  Anatomically abnormal facies? {yes***/no:17258::"no"}  Prominent incisors? {yes***/no:17258::"no"}  Receding mandible? {yes***/no:17258::"no"}  Short, thick neck? {yes***/no:17258::"no"}  Neck range of motion: {normal/abnormal:16337::"normal"}  Mallampati score: {score:16733}  Thyromental distance: {distance:16735}  Mouth opening: ***cm  Dentition: {exam; CXKGY:18563}  Cardiographics ECG: {findings; ecg:31282} Echocardiogram: {findings; JSHF:02637}  Imaging Chest x-ray: {findings;  x-ray:12530}   Lab Review  {recent CHYI:50277::"AJO applicable"}  Can use any labs obtained within the previous 4 mos.  Assessment:      61 y.o. male with planned surgery as above.   Known risk factors for perioperative complications: {risk INOMVEH:20947::"SJGG"}   Difficulty with intubation {is/is not:9024} anticipated.  Cardiac Risk Estimation: ***  Current medications which may produce withdrawal symptoms if withheld perioperatively: ***    Plan:  Consider checking cbc, bmp (could do Cr but would add on electrolytes since on lisinopril-htz   1. Preoperative workup as follows {studies; preop:16696}. 2. Change in medication regimen before surgery: {meds; preop:16697}. 3. Prophylaxis for cardiac events with perioperative beta-blockers: {preop beta-blockers:16698}. 4. Invasive hemodynamic monitoring perioperatively: {not indicated/strongly advised:16699}. 5. Deep vein thrombosis prophylaxis postoperatively:{dvt prophylaxis:16689}. 6. Surveillance for postoperative MI with ECG immediately postoperatively and on postoperative days 1 and 2 AND troponin levels 24 hours postoperatively and on day 4 or hospital discharge (whichever comes first): {not indicated/strongly advised:16699}. 7. Other measures: {preop recommendations:16691}

## 2016-11-12 ENCOUNTER — Ambulatory Visit: Payer: Self-pay | Admitting: Family Medicine

## 2016-11-12 DIAGNOSIS — Z6837 Body mass index (BMI) 37.0-37.9, adult: Secondary | ICD-10-CM

## 2016-11-12 DIAGNOSIS — E7800 Pure hypercholesterolemia, unspecified: Secondary | ICD-10-CM | POA: Insufficient documentation

## 2016-11-12 DIAGNOSIS — I10 Essential (primary) hypertension: Secondary | ICD-10-CM | POA: Insufficient documentation

## 2016-12-06 DIAGNOSIS — E782 Mixed hyperlipidemia: Secondary | ICD-10-CM | POA: Diagnosis not present

## 2016-12-06 DIAGNOSIS — I10 Essential (primary) hypertension: Secondary | ICD-10-CM | POA: Diagnosis not present

## 2016-12-06 DIAGNOSIS — J4521 Mild intermittent asthma with (acute) exacerbation: Secondary | ICD-10-CM | POA: Diagnosis not present

## 2016-12-06 DIAGNOSIS — N400 Enlarged prostate without lower urinary tract symptoms: Secondary | ICD-10-CM | POA: Diagnosis not present

## 2016-12-06 DIAGNOSIS — M169 Osteoarthritis of hip, unspecified: Secondary | ICD-10-CM | POA: Diagnosis not present

## 2016-12-06 DIAGNOSIS — C61 Malignant neoplasm of prostate: Secondary | ICD-10-CM | POA: Diagnosis not present

## 2017-01-02 DIAGNOSIS — K429 Umbilical hernia without obstruction or gangrene: Secondary | ICD-10-CM | POA: Diagnosis not present

## 2017-01-02 DIAGNOSIS — I10 Essential (primary) hypertension: Secondary | ICD-10-CM | POA: Diagnosis not present

## 2017-01-02 DIAGNOSIS — Z23 Encounter for immunization: Secondary | ICD-10-CM | POA: Diagnosis not present

## 2017-01-02 DIAGNOSIS — Z8546 Personal history of malignant neoplasm of prostate: Secondary | ICD-10-CM | POA: Diagnosis not present

## 2017-01-02 DIAGNOSIS — M519 Unspecified thoracic, thoracolumbar and lumbosacral intervertebral disc disorder: Secondary | ICD-10-CM | POA: Diagnosis not present

## 2017-03-12 DIAGNOSIS — J069 Acute upper respiratory infection, unspecified: Secondary | ICD-10-CM | POA: Diagnosis not present

## 2017-04-23 HISTORY — PX: COLONOSCOPY: SHX174

## 2017-04-26 DIAGNOSIS — M25551 Pain in right hip: Secondary | ICD-10-CM | POA: Diagnosis not present

## 2017-06-20 DIAGNOSIS — M169 Osteoarthritis of hip, unspecified: Secondary | ICD-10-CM | POA: Diagnosis not present

## 2017-07-05 DIAGNOSIS — M169 Osteoarthritis of hip, unspecified: Secondary | ICD-10-CM | POA: Diagnosis not present

## 2017-07-05 DIAGNOSIS — M519 Unspecified thoracic, thoracolumbar and lumbosacral intervertebral disc disorder: Secondary | ICD-10-CM | POA: Diagnosis not present

## 2017-07-05 DIAGNOSIS — R7303 Prediabetes: Secondary | ICD-10-CM | POA: Diagnosis not present

## 2017-07-05 DIAGNOSIS — I10 Essential (primary) hypertension: Secondary | ICD-10-CM | POA: Diagnosis not present

## 2017-07-05 DIAGNOSIS — Z1211 Encounter for screening for malignant neoplasm of colon: Secondary | ICD-10-CM | POA: Diagnosis not present

## 2017-07-05 DIAGNOSIS — E782 Mixed hyperlipidemia: Secondary | ICD-10-CM | POA: Diagnosis not present

## 2017-07-05 DIAGNOSIS — Z1389 Encounter for screening for other disorder: Secondary | ICD-10-CM | POA: Diagnosis not present

## 2017-07-05 DIAGNOSIS — Z125 Encounter for screening for malignant neoplasm of prostate: Secondary | ICD-10-CM | POA: Diagnosis not present

## 2017-07-05 DIAGNOSIS — Z0000 Encounter for general adult medical examination without abnormal findings: Secondary | ICD-10-CM | POA: Diagnosis not present

## 2017-07-08 ENCOUNTER — Encounter (INDEPENDENT_AMBULATORY_CARE_PROVIDER_SITE_OTHER): Payer: Self-pay | Admitting: Physician Assistant

## 2017-07-08 ENCOUNTER — Ambulatory Visit (INDEPENDENT_AMBULATORY_CARE_PROVIDER_SITE_OTHER): Payer: Medicare HMO

## 2017-07-08 ENCOUNTER — Ambulatory Visit (INDEPENDENT_AMBULATORY_CARE_PROVIDER_SITE_OTHER): Payer: Medicare HMO | Admitting: Physician Assistant

## 2017-07-08 DIAGNOSIS — M1611 Unilateral primary osteoarthritis, right hip: Secondary | ICD-10-CM

## 2017-07-08 NOTE — Progress Notes (Signed)
Office Visit Note   Patient: Rodney Maynard           Date of Birth: [birth year]           MRN: [id number] Visit Date: 07/08/2017              Requested by: [name], MD [address] [place], [place] [address] PCP: [name], MD   Assessment & Plan: Visit Diagnoses:  1. Primary osteoarthritis of right hip     Plan: He would like to try the anti-inflammatory that his primary care physician is prescribed for him he has not started this as of yet.  He would like to avoid surgery but he does state if he continues to have pain and decreased range of motion of the hip that affects his daily living he would definitely consider right total hip arthroplasty.  Therefore we will have him follow-up with Dr. Ninfa Linden in 2 months check his progress with the anti-inflammatory.  He was given a handout on anterior hip replacement.  Questions encouraged and answered at length today risk benefits reviewed with the patient.  Hip model was shown to him demonstrating right total hip arthroplasty including components.  Follow-Up Instructions: Return in about 2 months (around 09/07/2017).   Orders:  Orders Placed This Encounter  Procedures  . XR HIP UNILAT W OR W/O PELVIS 2-3 VIEWS RIGHT   No orders of the defined types were placed in this encounter.     Procedures: No procedures performed   Clinical Data: No additional findings.   Subjective: Chief Complaint  Patient presents with  . Right Hip - Pain    HPI Rodney Maynard is a pleasant 61 year old male comes in today for right hip pain.  States pain is became worse over the last month.  Does report a history of falling off a ladder several years ago and falling onto the right hip has had some pain in the hip since then.  He has difficulty donning shoes and socks.  He has difficulty also getting in and out of the vehicle due to the decreased range of motion and pain in the right hip.  He states that approximately a month  ago he was given a intra-articular injection in the right hip and this did not help with his pain.  It sounds as if it was done under ultrasound.  He states masses pain is lateral aspect of his hip and groin region.  He ambulates with a cane.  He has had no therapy is tried ibuprofen without any relief. Review of Systems Denies any fevers, chills, shortness of breath, chest pain, radicular symptoms down either leg or nausea/vomiting.  Please see HPI otherwise negative  Objective: Vital Signs: 5 foot 6 inches 209 pounds BMI 33.7  Physical Exam  Constitutional: He is oriented to person, place, and time. He appears well-developed and well-nourished. No distress.  Pulmonary/Chest: Effort normal.  Neurological: He is alert and oriented to person, place, and time.  Skin: He is not diaphoretic.  Psychiatric: He has a normal mood and affect. His behavior is normal.    Ortho Exam Left hip good range of motion without pain.  Right hip he has decreased internal and external rotation and pain with internal rotation of the right hip.  Leg lengths appear equal.  He has difficulty straightening his right leg out fully whenever supine due to pain in the hip.  Ambulates with a cane and an antalgic gait. Specialty Comments:  No specialty comments available.  Imaging: Xr Hip Unilat W Or W/o Pelvis 2-3 Views Right  Result Date: 07/08/2017 AP pelvis and lateral view right hip: Shows osteophytes about the femoral head and near bone-on-bone right hip.  No acute fractures.  Left hip is well preserved.  Bilateral hips well located    PMFS History: Patient Active Problem List   Diagnosis Date Noted  . Essential hypertension 11/12/2016  . Pure hypercholesterolemia 11/12/2016  . Class 2 severe obesity due to excess calories with serious comorbidity and body mass index (BMI) of 37.0 to 37.9 in adult ([place]) 11/12/2016  . Malignant neoplasm of prostate ([place]) 01/13/2014   Past Medical History:  Diagnosis Date  .  Abnormal prostate biopsy 2012  . Arthritis    arthritis back-fingers, disc disease degenerative  with sciatic  pain both legs right > left.  . Cancer [facility])    prostate cancer, bx. 7'15, 2012 bx- was obseved until PSA showed steady increase.  . High cholesterol   . Hypertension     History reviewed. No pertinent family history.  Past Surgical History:  Procedure Laterality Date  . CIRCUMCISION  1980  . INNER EAR SURGERY Right   . LYMPHADENECTOMY Bilateral 01/13/2014   Procedure: LYMPHADENECTOMY;  Surgeon: Bernestine Amass, MD;  Location: [facility] ORS;  Service: Urology;  Laterality: Bilateral;  . PROSTATE BIOPSY    . ROBOT ASSISTED LAPAROSCOPIC RADICAL PROSTATECTOMY N/A 01/13/2014   Procedure: ROBOTIC ASSISTED LAPAROSCOPIC RADICAL PROSTATECTOMY;  Surgeon: Bernestine Amass, MD;  Location: [facility];  Service: Urology;  Laterality: N/A;  . TONSILLECTOMY     Social History   Occupational History  . Not on file  Tobacco Use  . Smoking status: Former Smoker    Last attempt to quit: 01/11/1974    Years since quitting: 43.5  Substance and Sexual Activity  . Alcohol use: No  . Drug use: No  . Sexual activity: Yes

## 2017-09-09 ENCOUNTER — Encounter (INDEPENDENT_AMBULATORY_CARE_PROVIDER_SITE_OTHER): Payer: Self-pay | Admitting: Orthopaedic Surgery

## 2017-09-09 ENCOUNTER — Ambulatory Visit (INDEPENDENT_AMBULATORY_CARE_PROVIDER_SITE_OTHER): Payer: Medicare HMO | Admitting: Orthopaedic Surgery

## 2017-09-09 DIAGNOSIS — M1611 Unilateral primary osteoarthritis, right hip: Secondary | ICD-10-CM

## 2017-09-09 NOTE — Progress Notes (Signed)
The patient is only seen recently.  He has known severe end-stage arthritis of his right hip.  He is walking with a walking cane and his pain is daily.  At this point is become 10 out of 10.  It is detrimentally affect is actually living, his quality life, his mobility.  Is been going on for well over a year now.  He is tried and failed all forms conservative treatment including anti-inflammatories, rest, activity modification, using assistive device to get around, a home exercise program with hip strengthening exercises and an injection.  X-rays were rereviewed today of his pelvis and right hip show severe end-stage arthritis of the right hip.  He essentially has no joint space left on the right side.  Is severely narrow.  There is particular osteophytes and sclerotic changes as well.  On exam his left hip moves normally.  His right hip has significant limitations with internal extra rotation with severe pain in the groin.  At this point he does wish to consider being set up for hip replacement surgery.  Is not a diabetic.  I went over hip model with him in the office and explained in detail what the surgery involves including a thorough discussion of the risk and benefits of surgery.  I talked about his intraoperative and postoperative course as well.  All questions and concerns were answered and addressed.  He will talk to his wife about this and I gave him our surgery schedulers card.  We can get this scheduled when he decides to have this done.  He will give [place] a call.

## 2017-09-13 DIAGNOSIS — K635 Polyp of colon: Secondary | ICD-10-CM | POA: Diagnosis not present

## 2017-09-13 DIAGNOSIS — Z86010 Personal history of colonic polyps: Secondary | ICD-10-CM | POA: Diagnosis not present

## 2017-09-18 DIAGNOSIS — K635 Polyp of colon: Secondary | ICD-10-CM | POA: Diagnosis not present

## 2017-10-04 ENCOUNTER — Other Ambulatory Visit (INDEPENDENT_AMBULATORY_CARE_PROVIDER_SITE_OTHER): Payer: Self-pay

## 2017-10-18 ENCOUNTER — Other Ambulatory Visit (INDEPENDENT_AMBULATORY_CARE_PROVIDER_SITE_OTHER): Payer: Self-pay | Admitting: Physician Assistant

## 2017-10-22 ENCOUNTER — Encounter (HOSPITAL_COMMUNITY): Payer: Self-pay

## 2017-10-22 DIAGNOSIS — Z8249 Family history of ischemic heart disease and other diseases of the circulatory system: Secondary | ICD-10-CM | POA: Diagnosis not present

## 2017-10-22 DIAGNOSIS — Z823 Family history of stroke: Secondary | ICD-10-CM | POA: Diagnosis not present

## 2017-10-22 DIAGNOSIS — Z833 Family history of diabetes mellitus: Secondary | ICD-10-CM | POA: Diagnosis not present

## 2017-10-22 DIAGNOSIS — I10 Essential (primary) hypertension: Secondary | ICD-10-CM | POA: Diagnosis not present

## 2017-10-22 DIAGNOSIS — G629 Polyneuropathy, unspecified: Secondary | ICD-10-CM | POA: Diagnosis not present

## 2017-10-22 DIAGNOSIS — Z791 Long term (current) use of non-steroidal anti-inflammatories (NSAID): Secondary | ICD-10-CM | POA: Diagnosis not present

## 2017-10-22 DIAGNOSIS — Z6833 Body mass index (BMI) 33.0-33.9, adult: Secondary | ICD-10-CM | POA: Diagnosis not present

## 2017-10-22 DIAGNOSIS — N529 Male erectile dysfunction, unspecified: Secondary | ICD-10-CM | POA: Diagnosis not present

## 2017-10-22 DIAGNOSIS — E669 Obesity, unspecified: Secondary | ICD-10-CM | POA: Diagnosis not present

## 2017-10-22 DIAGNOSIS — G8929 Other chronic pain: Secondary | ICD-10-CM | POA: Diagnosis not present

## 2017-10-22 NOTE — Patient Instructions (Signed)
Your procedure is scheduled on: Friday, November 01, 2017   Surgery Time:  7:15AM-8:45AM   Report to [facility] Main  Entrance    Report to admitting at 5:30 AM   Call this number if you have problems the morning of surgery [phone]   Do not eat food or drink liquids :After Midnight.   Do NOT smoke after Midnight   Take these medicines the morning of surgery with A SIP OF WATER: Amlodipine, Gabapentin, Indomethacin, Metoprolol                               You may not have any metal on your body including jewelry, and body piercings             Do not wear lotions, powders, perfumes/cologne, or deodorant                          Men may shave face and neck.   Do not bring valuables to the hospital. [place].   Contacts, dentures or bridgework may not be worn into surgery.   Leave suitcase in the car. After surgery it may be brought to your room.   Special Instructions: Bring a copy of your healthcare power of attorney and living will documents         the day of surgery if you haven't scanned them in before.              Please read over the following fact sheets you were given:  [facility] - Preparing for Surgery Before surgery, you can play an important role.  Because skin is not sterile, your skin needs to be as free of germs as possible.  You can reduce the number of germs on your skin by washing with CHG (chlorahexidine gluconate) soap before surgery.  CHG is an antiseptic cleaner which kills germs and bonds with the skin to continue killing germs even after washing. Please DO NOT use if you have an allergy to CHG or antibacterial soaps.  If your skin becomes reddened/irritated stop using the CHG and inform your nurse when you arrive at Short Stay. Do not shave (including legs and underarms) for at least 48 hours prior to the first CHG shower.  You may shave your face/neck.  Please follow these instructions  carefully:  1.  Shower with CHG Soap the night before surgery and the  morning of surgery.  2.  If you choose to wash your hair, wash your hair first as usual with your normal  shampoo.  3.  After you shampoo, rinse your hair and body thoroughly to remove the shampoo.                             4.  Use CHG as you would any other liquid soap.  You can apply chg directly to the skin and wash.  Gently with a scrungie or clean washcloth.  5.  Apply the CHG Soap to your body ONLY FROM THE NECK DOWN.   Do   not use on face/ open                           Wound  or open sores. Avoid contact with eyes, ears mouth and   genitals (private parts).                       Wash face,  Genitals (private parts) with your normal soap.             6.  Wash thoroughly, paying special attention to the area where your    surgery  will be performed.  7.  Thoroughly rinse your body with warm water from the neck down.  8.  DO NOT shower/wash with your normal soap after using and rinsing off the CHG Soap.                9.  Pat yourself dry with a clean towel.            10.  Wear clean pajamas.            11.  Place clean sheets on your bed the night of your first shower and do not  sleep with pets. Day of Surgery : Do not apply any lotions/deodorants the morning of surgery.  Please wear clean clothes to the hospital/surgery center.  FAILURE TO FOLLOW THESE INSTRUCTIONS MAY RESULT IN THE CANCELLATION OF YOUR SURGERY  PATIENT SIGNATURE_________________________________  NURSE SIGNATURE__________________________________  ________________________________________________________________________   Adam Phenix  An incentive spirometer is a tool that can help keep your lungs clear and active. This tool measures how well you are filling your lungs with each breath. Taking long deep breaths may help reverse or decrease the chance of developing breathing (pulmonary) problems (especially infection) following:  A  long period of time when you are unable to move or be active. BEFORE THE PROCEDURE   If the spirometer includes an indicator to show your best effort, your nurse or respiratory therapist will set it to a desired goal.  If possible, sit up straight or lean slightly forward. Try not to slouch.  Hold the incentive spirometer in an upright position. INSTRUCTIONS FOR USE  1. Sit on the edge of your bed if possible, or sit up as far as you can in bed or on a chair. 2. Hold the incentive spirometer in an upright position. 3. Breathe out normally. 4. Place the mouthpiece in your mouth and seal your lips tightly around it. 5. Breathe in slowly and as deeply as possible, raising the piston or the ball toward the top of the column. 6. Hold your breath for 3-5 seconds or for as long as possible. Allow the piston or ball to fall to the bottom of the column. 7. Remove the mouthpiece from your mouth and breathe out normally. 8. Rest for a few seconds and repeat Steps 1 through 7 at least 10 times every 1-2 hours when you are awake. Take your time and take a few normal breaths between deep breaths. 9. The spirometer may include an indicator to show your best effort. Use the indicator as a goal to work toward during each repetition. 10. After each set of 10 deep breaths, practice coughing to be sure your lungs are clear. If you have an incision (the cut made at the time of surgery), support your incision when coughing by placing a pillow or rolled up towels firmly against it. Once you are able to get out of bed, walk around indoors and cough well. You may stop using the incentive spirometer when instructed by your caregiver.  RISKS AND COMPLICATIONS  Take your time so  you do not get dizzy or light-headed.  If you are in pain, you may need to take or ask for pain medication before doing incentive spirometry. It is harder to take a deep breath if you are having pain. AFTER USE  Rest and breathe slowly and  easily.  It can be helpful to keep track of a log of your progress. Your caregiver can provide you with a simple table to help with this. If you are using the spirometer at home, follow these instructions: [place] IF:   You are having difficultly using the spirometer.  You have trouble using the spirometer as often as instructed.  Your pain medication is not giving enough relief while using the spirometer.  You develop fever of 100.5 F (38.1 C) or higher. SEEK IMMEDIATE MEDICAL CARE IF:   You cough up bloody sputum that had not been present before.  You develop fever of 102 F (38.9 C) or greater.  You develop worsening pain at or near the incision site. MAKE SURE YOU:   Understand these instructions.  Will watch your condition.  Will get help right away if you are not doing well or get worse. Document Released: 08/20/2006 Document Revised: 07/02/2011 Document Reviewed: 10/21/2006 ExitCare Patient Information 2014 [employer], [place].   ________________________________________________________________________  WHAT IS A BLOOD TRANSFUSION? Blood Transfusion Information  A transfusion is the replacement of blood or some of its parts. Blood is made up of multiple cells which provide different functions.  Red blood cells carry oxygen and are used for blood loss replacement.  White blood cells fight against infection.  Platelets control bleeding.  Plasma helps clot blood.  Other blood products are available for specialized needs, such as hemophilia or other clotting disorders. BEFORE THE TRANSFUSION  Who gives blood for transfusions?   Healthy volunteers who are fully evaluated to make sure their blood is safe. This is blood bank blood. Transfusion therapy is the safest it has ever been in the practice of medicine. Before blood is taken from a donor, a complete history is taken to make sure that person has no history of diseases nor engages in risky social  behavior (examples are intravenous drug use or sexual activity with multiple partners). The donor's travel history is screened to minimize risk of transmitting infections, such as malaria. The donated blood is tested for signs of infectious diseases, such as HIV and hepatitis. The blood is then tested to be sure it is compatible with you in order to minimize the chance of a transfusion reaction. If you or a relative donates blood, this is often done in anticipation of surgery and is not appropriate for emergency situations. It takes many days to process the donated blood. RISKS AND COMPLICATIONS Although transfusion therapy is very safe and saves many lives, the main dangers of transfusion include:   Getting an infectious disease.  Developing a transfusion reaction. This is an allergic reaction to something in the blood you were given. Every precaution is taken to prevent this. The decision to have a blood transfusion has been considered carefully by your caregiver before blood is given. Blood is not given unless the benefits outweigh the risks. AFTER THE TRANSFUSION  Right after receiving a blood transfusion, you will usually feel much better and more energetic. This is especially true if your red blood cells have gotten low (anemic). The transfusion raises the level of the red blood cells which carry oxygen, and this usually causes an energy increase.  The nurse  administering the transfusion will monitor you carefully for complications. HOME CARE INSTRUCTIONS  No special instructions are needed after a transfusion. You may find your energy is better. Speak with your caregiver about any limitations on activity for underlying diseases you may have. SEEK MEDICAL CARE IF:   Your condition is not improving after your transfusion.  You develop redness or irritation at the intravenous (IV) site. SEEK IMMEDIATE MEDICAL CARE IF:  Any of the following symptoms occur over the next 12 hours:  Shaking  chills.  You have a temperature by mouth above 102 F (38.9 C), not controlled by medicine.  Chest, back, or muscle pain.  People around you feel you are not acting correctly or are confused.  Shortness of breath or difficulty breathing.  Dizziness and fainting.  You get a rash or develop hives.  You have a decrease in urine output.  Your urine turns a dark color or changes to pink, red, or [place]. Any of the following symptoms occur over the next 10 days:  You have a temperature by mouth above 102 F (38.9 C), not controlled by medicine.  Shortness of breath.  Weakness after normal activity.  The white part of the eye turns yellow (jaundice).  You have a decrease in the amount of urine or are urinating less often.  Your urine turns a dark color or changes to pink, red, or [place]. Document Released: 04/06/2000 Document Revised: 07/02/2011 Document Reviewed: 11/24/2007 [facility] Patient Information 2014 [place], [place].  _______________________________________________________________________

## 2017-10-25 ENCOUNTER — Other Ambulatory Visit: Payer: Self-pay

## 2017-10-25 ENCOUNTER — Encounter (HOSPITAL_COMMUNITY): Payer: Self-pay

## 2017-10-25 ENCOUNTER — Encounter (HOSPITAL_COMMUNITY)
Admission: RE | Admit: 2017-10-25 | Discharge: 2017-10-25 | Disposition: A | Discharge status: Home / Self Care | Payer: Medicare HMO | Source: Ambulatory Visit | Attending: Surgery | Admitting: Surgery

## 2017-10-25 DIAGNOSIS — Z01812 Encounter for preprocedural laboratory examination: Secondary | ICD-10-CM | POA: Diagnosis not present

## 2017-10-25 DIAGNOSIS — Z01810 Encounter for preprocedural cardiovascular examination: Secondary | ICD-10-CM | POA: Diagnosis not present

## 2017-10-25 HISTORY — DX: Umbilical hernia without obstruction or gangrene: K42.9

## 2017-10-25 HISTORY — DX: Cardiac murmur, unspecified: R01.1

## 2017-10-25 HISTORY — DX: Cyst of kidney, acquired: N28.1

## 2017-10-25 HISTORY — DX: Hydrocele, unspecified: N43.3

## 2017-10-25 HISTORY — DX: Cyst of epididymis: N50.3

## 2017-10-25 HISTORY — DX: Sciatica, unspecified side: M54.30

## 2017-10-25 HISTORY — DX: Benign cyst of testis: N44.2

## 2017-10-25 HISTORY — DX: Wheezing: R06.2

## 2017-10-25 LAB — BASIC METABOLIC PANEL
Anion gap: 8 (ref 5–15)
BUN: 14 mg/dL (ref 8–23)
CALCIUM: 9 mg/dL (ref 8.9–10.3)
CO2: 28 mmol/L (ref 22–32)
Chloride: 105 mmol/L (ref 98–111)
Creatinine, Ser: 1.07 mg/dL (ref 0.61–1.24)
GFR calc Af Amer: >60 mL/min (ref >60)
GLUCOSE: 112 mg/dL — AB (ref 70–99)
POTASSIUM: 3.5 mmol/L (ref 3.5–5.1)
SODIUM: 141 mmol/L (ref 135–145)

## 2017-10-25 LAB — CBC
HCT: 37 % — ABNORMAL LOW (ref 39.0–52.0)
Hemoglobin: 12.6 g/dL — ABNORMAL LOW (ref 13.0–17.0)
MCH: 31 pg (ref 26.0–34.0)
MCHC: 34.1 g/dL (ref 30.0–36.0)
MCV: 90.9 fL (ref 78.0–100.0)
PLATELETS: 242 10*3/uL (ref 150–400)
RBC: 4.07 MIL/uL — AB (ref 4.22–5.81)
RDW: 12.9 % (ref 11.5–15.5)
WBC: 5.4 10*3/uL (ref 4.0–10.5)

## 2017-10-25 LAB — SURGICAL PCR SCREEN
MRSA, PCR: NEGATIVE
Staphylococcus aureus: POSITIVE — AB

## 2017-10-25 NOTE — Pre-Procedure Instructions (Signed)
CBC and BMP results 10/25/2017 faxed to Dr. Ninfa Linden via epic.

## 2017-10-29 NOTE — Pre-Procedure Instructions (Signed)
PCR results 10/25/2017 faxed to Dr. Ninfa Linden via epic.

## 2017-10-31 MED ORDER — DEXTROSE 5 % IV SOLN
3.0000 g | INTRAVENOUS | Status: DC
  Filled 2017-10-31: qty 3000

## 2017-10-31 MED ORDER — TRANEXAMIC ACID 1000 MG/10ML IV SOLN
1000.0000 mg | INTRAVENOUS | Status: AC
  Administered 2017-11-01: 1000 mg via INTRAVENOUS
  Filled 2017-10-31: qty 1100

## 2017-11-01 ENCOUNTER — Inpatient Hospital Stay (HOSPITAL_COMMUNITY): Payer: Medicare HMO | Admitting: Anesthesiology

## 2017-11-01 ENCOUNTER — Encounter (HOSPITAL_COMMUNITY)
Admission: RE | Disposition: A | Discharge status: Home Health | Payer: Self-pay | Source: Home / Self Care | Attending: Orthopaedic Surgery

## 2017-11-01 ENCOUNTER — Other Ambulatory Visit: Payer: Self-pay

## 2017-11-01 ENCOUNTER — Inpatient Hospital Stay (HOSPITAL_COMMUNITY): Payer: Medicare HMO

## 2017-11-01 ENCOUNTER — Inpatient Hospital Stay (HOSPITAL_COMMUNITY)
Admission: RE | Admit: 2017-11-01 | Discharge: 2017-11-02 | DRG: 470 | Disposition: A | Discharge status: Home Health | Payer: Medicare HMO | Attending: Orthopaedic Surgery | Admitting: Orthopaedic Surgery

## 2017-11-01 ENCOUNTER — Encounter (HOSPITAL_COMMUNITY): Payer: Self-pay | Admitting: Anesthesiology

## 2017-11-01 DIAGNOSIS — Z6834 Body mass index (BMI) 34.0-34.9, adult: Secondary | ICD-10-CM

## 2017-11-01 DIAGNOSIS — Z96641 Presence of right artificial hip joint: Secondary | ICD-10-CM

## 2017-11-01 DIAGNOSIS — I10 Essential (primary) hypertension: Secondary | ICD-10-CM | POA: Diagnosis present

## 2017-11-01 DIAGNOSIS — Z471 Aftercare following joint replacement surgery: Secondary | ICD-10-CM | POA: Diagnosis not present

## 2017-11-01 DIAGNOSIS — Z419 Encounter for procedure for purposes other than remedying health state, unspecified: Secondary | ICD-10-CM

## 2017-11-01 DIAGNOSIS — M1611 Unilateral primary osteoarthritis, right hip: Secondary | ICD-10-CM | POA: Diagnosis not present

## 2017-11-01 DIAGNOSIS — Z8546 Personal history of malignant neoplasm of prostate: Secondary | ICD-10-CM

## 2017-11-01 HISTORY — PX: TOTAL HIP ARTHROPLASTY: SHX124

## 2017-11-01 SURGERY — ARTHROPLASTY, HIP, TOTAL, ANTERIOR APPROACH
Anesthesia: Monitor Anesthesia Care | Site: Hip | Laterality: Right

## 2017-11-01 MED ORDER — MIDAZOLAM HCL 5 MG/5ML IJ SOLN
INTRAMUSCULAR | Status: DC | PRN
  Administered 2017-11-01: 2 mg via INTRAVENOUS

## 2017-11-01 MED ORDER — CEFAZOLIN SODIUM-DEXTROSE 2-4 GM/100ML-% IV SOLN
2.0000 g | INTRAVENOUS | Status: AC
  Administered 2017-11-01: 2 g via INTRAVENOUS
  Filled 2017-11-01: qty 100

## 2017-11-01 MED ORDER — STERILE WATER FOR IRRIGATION IR SOLN
Status: DC | PRN
  Administered 2017-11-01: 2000 mL

## 2017-11-01 MED ORDER — LIDOCAINE 2% (20 MG/ML) 5 ML SYRINGE
INTRAMUSCULAR | Status: AC
  Filled 2017-11-01: qty 5

## 2017-11-01 MED ORDER — SODIUM CHLORIDE 0.9 % IR SOLN
Status: DC | PRN
  Administered 2017-11-01: 1000 mL

## 2017-11-01 MED ORDER — ASPIRIN 81 MG PO CHEW
81.0000 mg | CHEWABLE_TABLET | Freq: Two times a day (BID) | ORAL | Status: DC
  Administered 2017-11-01 – 2017-11-02 (×2): 81 mg via ORAL
  Filled 2017-11-01 (×2): qty 1

## 2017-11-01 MED ORDER — SODIUM CHLORIDE 0.9 % IV SOLN
INTRAVENOUS | Status: DC
  Administered 2017-11-01: 75 mL/h via INTRAVENOUS

## 2017-11-01 MED ORDER — HYDROMORPHONE HCL 1 MG/ML IJ SOLN
0.5000 mg | INTRAMUSCULAR | Status: DC | PRN
  Administered 2017-11-01: 1 mg via INTRAVENOUS
  Filled 2017-11-01: qty 1

## 2017-11-01 MED ORDER — MENTHOL 3 MG MT LOZG: 1.0000 | LOZENGE | OROMUCOSAL | Status: DC | PRN

## 2017-11-01 MED ORDER — METOCLOPRAMIDE HCL 5 MG/ML IJ SOLN: 5.0000 mg | Freq: Three times a day (TID) | INTRAMUSCULAR | Status: DC | PRN

## 2017-11-01 MED ORDER — OXYCODONE HCL 5 MG PO TABS
5.0000 mg | ORAL_TABLET | ORAL | Status: DC | PRN
  Administered 2017-11-01 – 2017-11-02 (×6): 10 mg via ORAL
  Filled 2017-11-01 (×6): qty 2

## 2017-11-01 MED ORDER — DEXAMETHASONE SODIUM PHOSPHATE 10 MG/ML IJ SOLN
INTRAMUSCULAR | Status: DC | PRN
  Administered 2017-11-01: 10 mg via INTRAVENOUS

## 2017-11-01 MED ORDER — ONDANSETRON HCL 4 MG/2ML IJ SOLN
INTRAMUSCULAR | Status: DC | PRN
  Administered 2017-11-01: 4 mg via INTRAVENOUS

## 2017-11-01 MED ORDER — GABAPENTIN 300 MG PO CAPS
300.0000 mg | ORAL_CAPSULE | Freq: Three times a day (TID) | ORAL | Status: DC
  Administered 2017-11-01 – 2017-11-02 (×3): 300 mg via ORAL
  Filled 2017-11-01 (×3): qty 1

## 2017-11-01 MED ORDER — METOCLOPRAMIDE HCL 5 MG PO TABS: 5.0000 mg | ORAL_TABLET | Freq: Three times a day (TID) | ORAL | Status: DC | PRN

## 2017-11-01 MED ORDER — CEFAZOLIN SODIUM-DEXTROSE 1-4 GM/50ML-% IV SOLN
1.0000 g | Freq: Four times a day (QID) | INTRAVENOUS | Status: AC
  Administered 2017-11-01 (×2): 1 g via INTRAVENOUS
  Filled 2017-11-01 (×2): qty 50

## 2017-11-01 MED ORDER — ONDANSETRON HCL 4 MG PO TABS: 4.0000 mg | ORAL_TABLET | Freq: Four times a day (QID) | ORAL | Status: DC | PRN

## 2017-11-01 MED ORDER — EPHEDRINE SULFATE-NACL 50-0.9 MG/10ML-% IV SOSY
PREFILLED_SYRINGE | INTRAVENOUS | Status: DC | PRN
  Administered 2017-11-01 (×2): 10 mg via INTRAVENOUS

## 2017-11-01 MED ORDER — OXYCODONE HCL 5 MG PO TABS: 5.0000 mg | ORAL_TABLET | Freq: Once | ORAL | Status: DC | PRN

## 2017-11-01 MED ORDER — ACETAMINOPHEN 325 MG PO TABS: 325.0000 mg | ORAL_TABLET | Freq: Four times a day (QID) | ORAL | Status: DC | PRN

## 2017-11-01 MED ORDER — ALUM & MAG HYDROXIDE-SIMETH 200-200-20 MG/5ML PO SUSP: 30.0000 mL | ORAL | Status: DC | PRN

## 2017-11-01 MED ORDER — PANTOPRAZOLE SODIUM 40 MG PO TBEC
40.0000 mg | DELAYED_RELEASE_TABLET | Freq: Every day | ORAL | Status: DC
  Administered 2017-11-01 – 2017-11-02 (×2): 40 mg via ORAL
  Filled 2017-11-01 (×2): qty 1

## 2017-11-01 MED ORDER — FENTANYL CITRATE (PF) 100 MCG/2ML IJ SOLN
INTRAMUSCULAR | Status: AC
  Filled 2017-11-01: qty 2

## 2017-11-01 MED ORDER — ONDANSETRON HCL 4 MG/2ML IJ SOLN
INTRAMUSCULAR | Status: AC
  Filled 2017-11-01: qty 2

## 2017-11-01 MED ORDER — AMLODIPINE BESYLATE 10 MG PO TABS
10.0000 mg | ORAL_TABLET | Freq: Every morning | ORAL | Status: DC
  Administered 2017-11-02: 10 mg via ORAL
  Filled 2017-11-01: qty 1

## 2017-11-01 MED ORDER — EPHEDRINE 5 MG/ML INJ
INTRAVENOUS | Status: AC
  Filled 2017-11-01: qty 10

## 2017-11-01 MED ORDER — METHOCARBAMOL 500 MG PO TABS
500.0000 mg | ORAL_TABLET | Freq: Four times a day (QID) | ORAL | Status: DC | PRN
  Administered 2017-11-02: 500 mg via ORAL
  Filled 2017-11-01: qty 1

## 2017-11-01 MED ORDER — FENTANYL CITRATE (PF) 100 MCG/2ML IJ SOLN
INTRAMUSCULAR | Status: DC | PRN
  Administered 2017-11-01: 100 ug via INTRAVENOUS

## 2017-11-01 MED ORDER — OXYCODONE HCL 5 MG/5ML PO SOLN
5.0000 mg | Freq: Once | ORAL | Status: DC | PRN
  Filled 2017-11-01: qty 5

## 2017-11-01 MED ORDER — DEXAMETHASONE SODIUM PHOSPHATE 10 MG/ML IJ SOLN
INTRAMUSCULAR | Status: AC
  Filled 2017-11-01: qty 1

## 2017-11-01 MED ORDER — ONDANSETRON HCL 4 MG/2ML IJ SOLN: 4.0000 mg | Freq: Four times a day (QID) | INTRAMUSCULAR | Status: DC | PRN

## 2017-11-01 MED ORDER — PROPOFOL 10 MG/ML IV BOLUS
INTRAVENOUS | Status: AC
  Filled 2017-11-01: qty 60

## 2017-11-01 MED ORDER — METHOCARBAMOL 1000 MG/10ML IJ SOLN
500.0000 mg | Freq: Four times a day (QID) | INTRAVENOUS | Status: DC | PRN
  Administered 2017-11-01: 500 mg via INTRAVENOUS
  Filled 2017-11-01: qty 550

## 2017-11-01 MED ORDER — DOCUSATE SODIUM 100 MG PO CAPS
100.0000 mg | ORAL_CAPSULE | Freq: Two times a day (BID) | ORAL | Status: DC
  Administered 2017-11-01 – 2017-11-02 (×2): 100 mg via ORAL
  Filled 2017-11-01 (×2): qty 1

## 2017-11-01 MED ORDER — PROPOFOL 500 MG/50ML IV EMUL
INTRAVENOUS | Status: DC | PRN
  Administered 2017-11-01: 75 ug/kg/min via INTRAVENOUS

## 2017-11-01 MED ORDER — OXYCODONE HCL 5 MG PO TABS: 10.0000 mg | ORAL_TABLET | ORAL | Status: DC | PRN

## 2017-11-01 MED ORDER — METOPROLOL SUCCINATE ER 50 MG PO TB24
100.0000 mg | ORAL_TABLET | Freq: Two times a day (BID) | ORAL | Status: DC
  Administered 2017-11-01 – 2017-11-02 (×2): 100 mg via ORAL
  Filled 2017-11-01 (×2): qty 2

## 2017-11-01 MED ORDER — CHLORHEXIDINE GLUCONATE 4 % EX LIQD: 60.0000 mL | Freq: Once | CUTANEOUS | Status: DC

## 2017-11-01 MED ORDER — FENTANYL CITRATE (PF) 100 MCG/2ML IJ SOLN: 25.0000 ug | INTRAMUSCULAR | Status: DC | PRN

## 2017-11-01 MED ORDER — LACTATED RINGERS IV SOLN
INTRAVENOUS | Status: DC
  Administered 2017-11-01 (×2): via INTRAVENOUS

## 2017-11-01 MED ORDER — MIDAZOLAM HCL 2 MG/2ML IJ SOLN
INTRAMUSCULAR | Status: AC
  Filled 2017-11-01: qty 2

## 2017-11-01 MED ORDER — PHENOL 1.4 % MT LIQD
1.0000 | OROMUCOSAL | Status: DC | PRN
  Filled 2017-11-01: qty 177

## 2017-11-01 MED ORDER — DIPHENHYDRAMINE HCL 12.5 MG/5ML PO ELIX: 12.5000 mg | ORAL_SOLUTION | ORAL | Status: DC | PRN

## 2017-11-01 SURGICAL SUPPLY — 39 items
BAG ZIPLOCK 12X15 (MISCELLANEOUS) ×2 IMPLANT
BENZOIN TINCTURE PRP APPL 2/3 (GAUZE/BANDAGES/DRESSINGS) ×2 IMPLANT
BLADE SAW SGTL 18X1.27X75 (BLADE) ×2 IMPLANT
CAPT HIP TOTAL 2 ×2 IMPLANT
COVER PERINEAL POST (MISCELLANEOUS) ×2 IMPLANT
COVER SURGICAL LIGHT HANDLE (MISCELLANEOUS) ×2 IMPLANT
DRAPE STERI IOBAN 125X83 (DRAPES) ×2 IMPLANT
DRAPE U-SHAPE 47X51 STRL (DRAPES) ×4 IMPLANT
DRESSING AQUACEL AG SP 3.5X10 (GAUZE/BANDAGES/DRESSINGS) ×1 IMPLANT
DRSG AQUACEL AG SP 3.5X10 (GAUZE/BANDAGES/DRESSINGS) ×2
DURAPREP 26ML APPLICATOR (WOUND CARE) ×2 IMPLANT
ELECT REM PT RETURN 15FT ADLT (MISCELLANEOUS) ×2 IMPLANT
GLOVE BIOGEL PI IND STRL 6.5 (GLOVE) ×1 IMPLANT
GLOVE BIOGEL PI IND STRL 7.5 (GLOVE) ×2 IMPLANT
GLOVE BIOGEL PI IND STRL 8 (GLOVE) ×3 IMPLANT
GLOVE BIOGEL PI INDICATOR 6.5 (GLOVE) ×1
GLOVE BIOGEL PI INDICATOR 7.5 (GLOVE) ×2
GLOVE BIOGEL PI INDICATOR 8 (GLOVE) ×3
GLOVE SURG SS PI 6.5 STRL IVOR (GLOVE) ×2 IMPLANT
GLOVE SURG SS PI 7.5 STRL IVOR (GLOVE) ×4 IMPLANT
GLOVE SURG SS PI 8.0 STRL IVOR (GLOVE) ×2 IMPLANT
GOWN STRL REUS W/ TWL LRG LVL4 (GOWN DISPOSABLE) ×1 IMPLANT
GOWN STRL REUS W/ TWL XL LVL3 (GOWN DISPOSABLE) ×1 IMPLANT
GOWN STRL REUS W/TWL LRG LVL4 (GOWN DISPOSABLE) ×1
GOWN STRL REUS W/TWL XL LVL3 (GOWN DISPOSABLE) ×5 IMPLANT
HANDPIECE INTERPULSE COAX TIP (DISPOSABLE) ×1
HOLDER FOLEY CATH W/STRAP (MISCELLANEOUS) ×2 IMPLANT
PACK ANTERIOR HIP CUSTOM (KITS) ×2 IMPLANT
SET HNDPC FAN SPRY TIP SCT (DISPOSABLE) ×1 IMPLANT
STEM CORAIL KLA11 (Stem) ×2 IMPLANT
STRIP CLOSURE SKIN 1/2X4 (GAUZE/BANDAGES/DRESSINGS) ×2 IMPLANT
SUT ETHIBOND NAB CT1 #1 30IN (SUTURE) ×2 IMPLANT
SUT MNCRL AB 4-0 PS2 18 (SUTURE) ×2 IMPLANT
SUT VIC AB 0 CT1 36 (SUTURE) ×2 IMPLANT
SUT VIC AB 1 CT1 36 (SUTURE) ×2 IMPLANT
SUT VIC AB 2-0 CT1 27 (SUTURE) ×2
SUT VIC AB 2-0 CT1 TAPERPNT 27 (SUTURE) ×2 IMPLANT
TRAY FOLEY CATH SILVER 16FR LF (SET/KITS/TRAYS/PACK) ×2 IMPLANT
YANKAUER SUCT BULB TIP 10FT TU (MISCELLANEOUS) ×2 IMPLANT

## 2017-11-01 NOTE — Op Note (Signed)
NAME: Rodney Maynard, Rodney Maynard MEDICAL RECORD DE:[id number] ACCOUNT [id number] DATE OF BIRTH:06-20-1955 FACILITY: [facility] LOCATION: [facility] PHYSICIAN:Emeterio Balke Kerry [place], MD  OPERATIVE REPORT  DATE OF PROCEDURE:  11/01/2017  PREOPERATIVE DIAGNOSIS:  Primary osteoarthritis and degenerative joint disease, right hip.  POSTOPERATIVE DIAGNOSIS:  Primary osteoarthritis and degenerative joint disease, right hip.  PROCEDURE:  Right total hip arthroplasty through direct anterior approach.  IMPLANTS:  DePuy Sector Gription acetabular component size 56, size 36+0 neutral polyethylene liner, size 12 Corail femoral component with varus offset, size 36+1.5 ceramic hip ball.  SURGEON:  Lind Guest. Ninfa Linden, MD  ASSISTANT:  Erskine Emery, PA-C  ANESTHESIA:  Spinal.  ANTIBIOTICS:  Two grams IV Ancef.  ESTIMATED BLOOD LOSS:  150 mL.  COMPLICATIONS:  None.  INDICATIONS:  The patient is a very pleasant 62 year old gentleman with debilitating arthritis involving his right hip.  This has been well documented, and his x-rays show complete loss of the right hip joint space.  At this point, this has detrimentally  affected his activities of daily living, his quality of life, his mobility.  His pain can be 10/10 at times.  It keeps him up at night as well.  At this point, he does wish to proceed with a total hip arthroplasty.  We had a long and thorough discussion  about this, and we talked about the risk of acute blood loss anemia, nerve or vessel injury, fracture, infection, skin breakdown issues, dislocation, DVT.  He understands the goals are to decrease pain, improve mobility and overall improve quality of  life.  DESCRIPTION OF PROCEDURE:  After informed consent was obtained and appropriate right hip was marked, he was brought to the operating room where spinal anesthesia was obtained while he was on a stretcher.  He was laid in the supine position on the  stretcher.  A Foley catheter was  placed, and both feet had traction boots applied on them.  Next, he was placed supine on the Hana fracture table with a perineal post in place and both legs in an in-line skeletal traction device with no traction applied.   His right operative hip was prepped and draped with DuraPrep and sterile drapes.  A time-out was called to identify correct patient, correct right hip.  We then made an incision just inferior and posterior to the anterior superior iliac spine and  carried this obliquely down the leg.  We dissected down tensor fascia lata muscle.  The tensor fascia was then divided longitudinally to proceed with direct anterior approach to the hip.  We identified and cauterized circumflex vessels.  I then  identified the hip capsule and opened up the hip capsule in an L-type format, finding a large joint effusion and significant arthritis around his right hip.  We then placed Cobra retractors around the medial and lateral femoral neck and made our femoral  neck cut just proximal to the lesser trochanter and completed this with an osteotome.  We placed a corkscrew guide in the femoral head and removed the femoral head in its entirety and found it to be completely devoid of cartilage.  We then placed a bent  Hohmann over the medial acetabular rim and removed remnants of the acetabular labrum and other debris from the acetabulum.  We then began reaming under direct visualization from a size 44 reamer in stepwise increments up to a size 55 with all reamers  under direct visualization, the last reamer under direct fluoroscopy so we could obtain our depth of reaming, our inclination  and anteversion.  We then placed the real DePuy Sector Gription acetabular component size 56 and a 36+0 neutral polyethylene  liner for that size acetabular component.  Attention was then turned to the femur.  With the leg externally rotated to 120 degrees, extended and adducted, we placed Mueller retractor medially and a Hohmann  retractor around the greater trochanter,  released the lateral joint capsule and used a box-cutting osteotome to enter femoral canal and a rongeur to lateralize them.  We began broaching from a size 8 broach using Corail broaching system going up to a size 12.  The size 12 was really in there  tight, and we were able to reduce this in the acetabulum with a 36+1.5 hip ball.  We were then pleased with the alignment there, and we dislocated the hip and had a hard time getting a size 12 stem out, so I went with a real size 11.  We put the real  size 11 down; however, it ended up being too loose, so I had to change out and go to a Corail varus offset femoral neck stem size 12.  We put the 12 in, and it filled the canal nicely and was very tight.  We then went with the real 36+1.5 ceramic hip  ball and reduced this in the acetabulum, and I was pleased with the stability at this point, leg length, offset and range of motion.  This was assessed clinically under direct fluoroscopy.  We then irrigated the soft tissues with normal saline solution  using pulsatile lavage.  We closed the joint capsule with interrupted #1 Ethibond suture, followed by running 0 Vicryl in tensor fascia, 0 Vicryl in the deep tissue, 2-0 Vicryl subcutaneous tissue, 4-0 Monocryl subcuticular stitch and Steri-Strips on the  skin.  An Aquacel dressing was applied.  He was taken off the Hana table and taken to recovery room in stable condition.  All final counts were correct.  There were no complications noted.  Of note, Benita Stabile, PA-C, assisted in the entire case.  His  assistance was crucial for facilitating all aspects of this case.  LN/NUANCE  D:11/01/2017 T:11/01/2017 JOB:[id number]

## 2017-11-01 NOTE — Anesthesia Procedure Notes (Signed)
Date/Time: 11/01/2017 7:16 AM Performed by: Sharlette Dense, CRNA Oxygen Delivery Method: Simple face mask

## 2017-11-01 NOTE — Anesthesia Procedure Notes (Signed)
Spinal  Patient location during procedure: OR Start time: 11/01/2017 7:17 AM End time: 11/01/2017 7:20 AM Staffing Resident/CRNA: Sharlette Dense, CRNA Performed: resident/CRNA  Preanesthetic Checklist Completed: patient identified, site marked, surgical consent, pre-op evaluation, timeout performed, IV checked, risks and benefits discussed and monitors and equipment checked Spinal Block Patient position: sitting Prep: Betadine Patient monitoring: heart rate, continuous pulse ox and blood pressure Approach: midline Location: L3-4 Injection technique: single-shot Needle Needle type: Sprotte  Needle gauge: 24 G Needle length: 9 cm Additional Notes Kit expiration 10/22/2018 and lot #[id number] Clear free flow of CSF, negative heme, negative paresthesia Tolerated well and returned to supine position

## 2017-11-01 NOTE — Evaluation (Signed)
Physical Therapy Evaluation Patient Details Name: Rodney STANWOOD Sr. MRN: [id number] DOB: 09-05-1955 Today's Date: 11/01/2017   History of Present Illness  s/p R  DATHA  Clinical Impression  Pt is s/p THA resulting in the deficits listed below (see PT Problem List). Pt able to amb ~ 52' with RW and min/guard assist this pm; pt feeling well, very pleasant and in good spirits regarding his surgery;  Pt will benefit from skilled PT to increase their independence and safety with mobility to allow discharge to the venue listed below.      Follow Up Recommendations Follow surgeon's recommendation for DC plan and follow-up therapies    Equipment Recommendations  Rolling walker with 5" wheels;3in1 (PT)    Recommendations for Other Services       Precautions / Restrictions Precautions Precautions: Fall Restrictions Weight Bearing Restrictions: No Other Position/Activity Restrictions: WBAT      Mobility  Bed Mobility Overal bed mobility: Needs Assistance Bed Mobility: Supine to Sit     Supine to sit: Min assist     General bed mobility comments: assist RLE  Transfers Overall transfer level: Needs assistance Equipment used: Rolling walker (2 wheeled) Transfers: Sit to/from Stand           General transfer comment: cues for hand placement and RLE position  Ambulation/Gait Ambulation/Gait assistance: Min guard Gait Distance (Feet): 80 Feet Assistive device: Rolling walker (2 wheeled) Gait Pattern/deviations: Step-to pattern;Decreased weight shift to right     General Gait Details: cues for sequence and RW position  Stairs            Wheelchair Mobility    Modified Rankin (Stroke Patients Only)       Balance                                             Pertinent Vitals/Pain Pain Assessment: 0-10 Pain Score: 3  Pain Location: right hip  Pain Descriptors / Indicators: Sore Pain Intervention(s): Limited activity within patient's  tolerance;Monitored during session    Home Living Family/patient expects to be discharged to:: Private residence Living Arrangements: Spouse/significant other   Type of Home: House Home Access: Stairs to enter   Technical brewer of Steps: 6" step Home Layout: One level Home Equipment: None 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    Prior Function Level of Independence: Independent               Hand Dominance        Extremity/Trunk Assessment   Upper Extremity Assessment Upper Extremity Assessment: Overall WFL for tasks assessed    Lower Extremity Assessment Lower Extremity Assessment: RLE deficits/detail RLE Deficits / Details: ankle WFL; knee and hip grossly 3+/5, limited by post op soreness       Communication   Communication: No difficulties  Cognition Arousal/Alertness: Awake/alert Behavior During Therapy: WFL for tasks assessed/performed Overall Cognitive Status: Within Functional Limits for tasks assessed                                        General Comments      Exercises Total Joint Exercises Ankle Circles/Pumps: AROM;Both;10 reps Quad Sets: AROM;Both;5 reps   Assessment/Plan    PT Assessment Patient needs continued PT services  PT Problem List Decreased strength;Decreased activity  tolerance;Decreased knowledge of use of DME;Decreased mobility       PT Treatment Interventions DME instruction;Gait training;Therapeutic activities;Therapeutic exercise;Patient/family education;Functional mobility training    PT Goals (Current goals can be found in the Care Plan section)  Acute Rehab PT Goals Patient Stated Goal: get back to work PT Goal Formulation: With patient Time For Goal Achievement: 11/08/17 Potential to Achieve Goals: Good    Frequency 7X/week   Barriers to discharge        Co-evaluation               AM-PAC PT "6 Clicks" Daily Activity  Outcome Measure Difficulty turning over in bed  (including adjusting bedclothes, sheets and blankets)?: Unable Difficulty moving from lying on back to sitting on the side of the bed? : A Lot Difficulty sitting down on and standing up from a chair with arms (e.g., wheelchair, bedside commode, etc,.)?: Unable Help needed moving to and from a bed to chair (including a wheelchair)?: A Little Help needed walking in hospital room?: A Little Help needed climbing 3-5 steps with a railing? : A Lot 6 Click Score: 12    End of Session Equipment Utilized During Treatment: Gait belt Activity Tolerance: Patient tolerated treatment well Patient left: in chair;with call bell/phone within reach;with family/visitor present;with chair alarm set Nurse Communication: Mobility status PT Visit Diagnosis: Difficulty in walking, not elsewhere classified (R26.2)    Time: 7106-2694 PT Time Calculation (min) (ACUTE ONLY): 31 min   Charges:   PT Evaluation $PT Eval Low Complexity: 1 Low PT Treatments $Gait Training: 8-22 mins   PT G CodesKenyon Ana, PT Pager: [phone] 11/01/2017   [facility] 11/01/2017, 3:26 PM

## 2017-11-01 NOTE — Brief Op Note (Signed)
11/01/2017  8:34 AM  PATIENT:  Rodney Maynard.  62 y.o. male  PRE-OPERATIVE DIAGNOSIS:  right hip endstage osteoarthritis  POST-OPERATIVE DIAGNOSIS:  right hip endstage osteoarthritis  PROCEDURE:  Procedure(s): RIGHT TOTAL HIP ARTHROPLASTY ANTERIOR APPROACH (Right)  SURGEON:  Surgeon(s) and Role:    Mcarthur Rossetti, MD - Primary  PHYSICIAN ASSISTANT: Benita Stabile, PA-C  ANESTHESIA:   spinal  EBL:  150 mL   COUNTS:  YES  DICTATION: .Other Dictation: Dictation Number [id number]  PLAN OF CARE: Admit to inpatient   PATIENT DISPOSITION:  PACU - hemodynamically stable.   Delay start of Pharmacological VTE agent (>24hrs) due to surgical blood loss or risk of bleeding: no

## 2017-11-01 NOTE — Anesthesia Procedure Notes (Deleted)
Spinal

## 2017-11-01 NOTE — Anesthesia Preprocedure Evaluation (Signed)
Anesthesia Evaluation  Patient identified by MRN, date of birth, ID band Patient awake    Reviewed: Allergy & Precautions, NPO status , Patient's Chart, lab work & pertinent test results  History of Anesthesia Complications Negative for: history of anesthetic complications  Airway Mallampati: I  TM Distance: >3 FB Neck ROM: Full    Dental  (+) Edentulous Upper, Partial Lower   Pulmonary former smoker,    breath sounds clear to auscultation       Cardiovascular hypertension, Pt. on medications (-) angina(-) Past MI and (-) CHF  Rhythm:Regular     Neuro/Psych  Neuromuscular disease negative psych ROS   GI/Hepatic negative GI ROS, Neg liver ROS,   Endo/Other  Morbid obesity  Renal/GU Renal disease     Musculoskeletal  (+) Arthritis ,   Abdominal   Peds  Hematology   Anesthesia Other Findings   Reproductive/Obstetrics                             Anesthesia Physical Anesthesia Plan  ASA: II  Anesthesia Plan: MAC and Spinal   Post-op Pain Management:    Induction:   PONV Risk Score and Plan: 1 and Treatment may vary due to age or medical condition  Airway Management Planned: Nasal Cannula  Additional Equipment:   Intra-op Plan:   Post-operative Plan:   Informed Consent: I have reviewed the patients History and Physical, chart, labs and discussed the procedure including the risks, benefits and alternatives for the proposed anesthesia with the patient or authorized representative who has indicated his/her understanding and acceptance.   Dental advisory given  Plan Discussed with: CRNA and Surgeon  Anesthesia Plan Comments:         Anesthesia Quick Evaluation

## 2017-11-01 NOTE — Transfer of Care (Signed)
Immediate Anesthesia Transfer of Care Note  Patient: Rodney Jacks Sr.  Procedure(s) Performed: RIGHT TOTAL HIP ARTHROPLASTY ANTERIOR APPROACH (Right Hip)  Patient Location: PACU  Anesthesia Type:Spinal  Level of Consciousness: sedated  Airway & Oxygen Therapy: Patient Spontanous Breathing and Patient connected to face mask oxygen  Post-op Assessment: Report given to RN and Post -op Vital signs reviewed and stable  Post vital signs: Reviewed and stable  Last Vitals:  Vitals Value Taken Time  BP 114/64 11/01/2017  8:53 AM  Temp    Pulse 67 11/01/2017  8:54 AM  Resp 21 11/01/2017  8:54 AM  SpO2 97 % 11/01/2017  8:54 AM  Vitals shown include unvalidated device data.  Last Pain: There were no vitals filed for this visit.       Complications: No apparent anesthesia complications

## 2017-11-01 NOTE — Anesthesia Postprocedure Evaluation (Signed)
Anesthesia Post Note  Patient: Rodney RHO Sr.  Procedure(s) Performed: RIGHT TOTAL HIP ARTHROPLASTY ANTERIOR APPROACH (Right Hip)     Patient location during evaluation: PACU Anesthesia Type: MAC and Spinal Level of consciousness: awake and alert Pain management: pain level controlled Vital Signs Assessment: post-procedure vital signs reviewed and stable Respiratory status: spontaneous breathing, nonlabored ventilation, respiratory function stable and patient connected to nasal cannula oxygen Cardiovascular status: stable and blood pressure returned to baseline Postop Assessment: no apparent nausea or vomiting and spinal receding Anesthetic complications: no    Last Vitals:  Vitals:   11/01/17 1206 11/01/17 1309  BP: 133/88 135/87  Pulse: (!) 58 61  Resp: 16 14  Temp: (!) 36.4 C 36.6 C  SpO2: 98% 99%    Last Pain:  Vitals:   11/01/17 1323  TempSrc:   PainSc: 10-Worst pain ever                 Albie Arizpe

## 2017-11-01 NOTE — H&P (Signed)
TOTAL HIP ADMISSION H&P  Patient is admitted for right total hip arthroplasty.  Subjective:  Chief Complaint: right hip pain  HPI: Rodney Jacks Sr., 62 y.o. male, has a history of pain and functional disability in the right hip(s) due to arthritis and patient has failed non-surgical conservative treatments for greater than 12 weeks to include NSAID's and/or analgesics, corticosteriod injections, flexibility and strengthening excercises, use of assistive devices, weight reduction as appropriate and activity modification.  Onset of symptoms was gradual starting 2 years ago with gradually worsening course since that time.The patient noted no past surgery on the right hip(s).  Patient currently rates pain in the right hip at 10 out of 10 with activity. Patient has night pain, worsening of pain with activity and weight bearing, trendelenberg gait, pain that interfers with activities of daily living and pain with passive range of motion. Patient has evidence of subchondral sclerosis, periarticular osteophytes and joint space narrowing by imaging studies. This condition presents safety issues increasing the risk of falls.  There is no current active infection.  Patient Active Problem List   Diagnosis Date Noted  . Primary osteoarthritis of right hip 09/09/2017  . Essential hypertension 11/12/2016  . Pure hypercholesterolemia 11/12/2016  . Class 2 severe obesity due to excess calories with serious comorbidity and body mass index (BMI) of 37.0 to 37.9 in adult ([place]) 11/12/2016  . Malignant neoplasm of prostate ([place]) 01/13/2014   Past Medical History:  Diagnosis Date  . Abnormal prostate biopsy 2012  . Arthritis    arthritis back-fingers, disc disease degenerative  with sciatic  pain both legs right > left.  . Bilateral hydrocele   . Bilateral renal cysts   . Cancer [facility])    prostate cancer, bx. 7'15, 2012 bx- was obseved until PSA showed steady increase.  Marland Kitchen Epididymal cyst    Bilateral head   . Heart murmur    pt denies  . High cholesterol   . Hypertension   . Sciatica   . Testicular cyst    Right  . Umbilical hernia   . Wheezing    Occ.    Past Surgical History:  Procedure Laterality Date  . CIRCUMCISION  1980  . COLONOSCOPY  2019  . INNER EAR SURGERY Right   . LYMPHADENECTOMY Bilateral 01/13/2014   Procedure: LYMPHADENECTOMY;  Surgeon: Bernestine Amass, MD;  Location: [facility];  Service: Urology;  Laterality: Bilateral;  . PROSTATE BIOPSY    . ROBOT ASSISTED LAPAROSCOPIC RADICAL PROSTATECTOMY N/A 01/13/2014   Procedure: ROBOTIC ASSISTED LAPAROSCOPIC RADICAL PROSTATECTOMY;  Surgeon: Bernestine Amass, MD;  Location: [facility];  Service: Urology;  Laterality: N/A;  . TONSILLECTOMY      Current Facility-Administered Medications  Medication Dose Route Frequency Provider Last Rate Last Dose  . ceFAZolin (ANCEF) IVPB 2g/100 mL premix  2 g Intravenous On Call to [place], Blaine, MD      . chlorhexidine (HIBICLENS) 4 % liquid 4 application  60 mL Topical Once Pete Pelt, PA-C      . lactated ringers infusion   Intravenous Continuous Oleta Mouse, MD 50 mL/hr at 11/01/17 [phone]    . tranexamic acid (CYKLOKAPRON) 1,000 mg in sodium chloride 0.9 % 100 mL IVPB  1,000 mg Intravenous To OR Mcarthur Rossetti, MD       Allergies  Allergen Reactions  . Latex Itching    Social History   Tobacco Use  . Smoking status: Former Smoker    Last attempt to quit:  01/11/1974    Years since quitting: 43.8  . Smokeless tobacco: Never Used  Substance Use Topics  . Alcohol use: Not Currently    History reviewed. No pertinent family history.   Review of Systems  Musculoskeletal: Positive for joint pain.  All other systems reviewed and are negative.   Objective:  Physical Exam  Constitutional: He is oriented to person, place, and time. He appears well-developed and well-nourished.  HENT:  Head: Normocephalic and atraumatic.  Eyes: Pupils are equal, round, and  reactive to light. EOM are normal.  Neck: Normal range of motion. Neck supple.  Cardiovascular: Normal rate and regular rhythm.  Respiratory: Effort normal and breath sounds normal.  GI: Soft. Bowel sounds are normal.  Musculoskeletal:       Right hip: He exhibits decreased range of motion, decreased strength, tenderness and bony tenderness.  Neurological: He is alert and oriented to person, place, and time.  Skin: Skin is warm and dry.  Psychiatric: He has a normal mood and affect.    Vital signs in last 24 hours: Temp:  [98.7 F (37.1 C)] 98.7 F (37.1 C) (07/12 0550) Pulse Rate:  [57] 57 (07/12 0550) Resp:  [18] 18 (07/12 0550) BP: (150)/(99) 150/99 (07/12 0550) SpO2:  [98 %] 98 % (07/12 0550) Weight:  [211 lb (95.7 kg)] 211 lb (95.7 kg) (07/12 0539)  Labs:   Estimated body mass index is 34.06 kg/m as calculated from the following:   Height as of this encounter: 5\' 6"  (1.676 m).   Weight as of this encounter: 211 lb (95.7 kg).   Imaging Review Plain radiographs demonstrate severe degenerative joint disease of the right hip(s). The bone quality appears to be excellent for age and reported activity level.    Preoperative templating of the joint replacement has been completed, documented, and submitted to the Operating Room personnel in order to optimize intra-operative equipment management.     Assessment/Plan:  End stage arthritis, right hip(s)  The patient history, physical examination, clinical judgement of the provider and imaging studies are consistent with end stage degenerative joint disease of the right hip(s) and total hip arthroplasty is deemed medically necessary. The treatment options including medical management, injection therapy, arthroscopy and arthroplasty were discussed at length. The risks and benefits of total hip arthroplasty were presented and reviewed. The risks due to aseptic loosening, infection, stiffness, dislocation/subluxation,   thromboembolic complications and other imponderables were discussed.  The patient acknowledged the explanation, agreed to proceed with the plan and consent was signed. Patient is being admitted for inpatient treatment for surgery, pain control, PT, OT, prophylactic antibiotics, VTE prophylaxis, progressive ambulation and ADL's and discharge planning.The patient is planning to be discharged home with home health services

## 2017-11-02 LAB — CBC
HEMATOCRIT: 35.2 % — AB (ref 39.0–52.0)
HEMOGLOBIN: 12.1 g/dL — AB (ref 13.0–17.0)
MCH: 31.2 pg (ref 26.0–34.0)
MCHC: 34.4 g/dL (ref 30.0–36.0)
MCV: 90.7 fL (ref 78.0–100.0)
PLATELETS: 242 10*3/uL (ref 150–400)
RBC: 3.88 MIL/uL — AB (ref 4.22–5.81)
RDW: 12.9 % (ref 11.5–15.5)
WBC: 12 10*3/uL — AB (ref 4.0–10.5)

## 2017-11-02 LAB — BASIC METABOLIC PANEL
Anion gap: 8 (ref 5–15)
BUN: 18 mg/dL (ref 8–23)
CALCIUM: 8.7 mg/dL — AB (ref 8.9–10.3)
CO2: 27 mmol/L (ref 22–32)
Chloride: 103 mmol/L (ref 98–111)
Creatinine, Ser: 1.13 mg/dL (ref 0.61–1.24)
GFR calc non Af Amer: >60 mL/min (ref >60)
Glucose, Bld: 178 mg/dL — ABNORMAL HIGH (ref 70–99)
Potassium: 3.9 mmol/L (ref 3.5–5.1)
Sodium: 138 mmol/L (ref 135–145)

## 2017-11-02 MED ORDER — METHOCARBAMOL 500 MG PO TABS: 500.0000 mg | ORAL_TABLET | Freq: Four times a day (QID) | ORAL | 0 refills | Status: DC | PRN

## 2017-11-02 MED ORDER — ASPIRIN 81 MG PO CHEW: 81.0000 mg | CHEWABLE_TABLET | Freq: Two times a day (BID) | ORAL | 0 refills | Status: AC

## 2017-11-02 MED ORDER — OXYCODONE HCL 5 MG PO TABS: 5.0000 mg | ORAL_TABLET | ORAL | 0 refills | Status: DC | PRN

## 2017-11-02 NOTE — Care Management Note (Signed)
Case Management Note  Patient Details  Name: Rodney Maynard. MRN: [id number] Date of Birth: Sep 05, 1955  Subjective/Objective: 62 yo M, s/p R THA                  Action/Plan: Received referral to assist with [facility] and DME   Expected Discharge Date:  11/02/17               Expected Discharge Plan:  [place]  In-House Referral:     Discharge planning Services  CM Consult  Post Acute Care Choice:  Home Health Choice offered to:  Spouse, Patient  DME Arranged:  3-N-1, Walker rolling DME Agency:  [place]:  PT [place] Agency:  [place]  Status of Service:  Completed, signed off  If discussed at [place] of Stay Meetings, dates discussed:    Additional Comments: Met with pt and wife. Pt plans to return home with the support of his wife. He needs a RW and a 3-in-1 BSC. Discussed preference for a [facility] agency. They stated that they don't have a preference for a [place] agency. Provided wife with a list of [place] agencies. She chose [facility]. Contacted [facility] and [facility] for referral.  Norina Buzzard, RN 11/02/2017, 10:32 AM

## 2017-11-02 NOTE — Progress Notes (Signed)
Physical Therapy Treatment Patient Details Name: Rodney LANUZA Sr. MRN: [id number] DOB: 1955-12-03 Today's Date: 11/02/2017    History of Present Illness s/p R  DATHA    PT Comments    Pt progressing well, he is very motivated; feels ready to d/c home today after amb and stair training; reviewed HEP with pt; pt to complete other exercises later today and progress to standing exercises/balance activities with HHPT  Follow Up Recommendations  Follow surgeon's recommendation for DC plan and follow-up therapies     Equipment Recommendations  Rolling walker with 5" wheels;3in1 (PT)    Recommendations for Other Services       Precautions / Restrictions Precautions Precautions: Fall Restrictions Weight Bearing Restrictions: No Other Position/Activity Restrictions: WBAT    Mobility  Bed Mobility               General bed mobility comments: up with OT  Transfers Overall transfer level: Needs assistance Equipment used: Rolling walker (2 wheeled) Transfers: Sit to/from Stand Sit to Stand: Min guard;Supervision         General transfer comment: cues for hand placement and RLE position  Ambulation/Gait Ambulation/Gait assistance: Min Gaffer (Feet): 120 Feet Assistive device: Rolling walker (2 wheeled) Gait Pattern/deviations: Step-to pattern;Decreased weight shift to right     General Gait Details: cues for sequence and RW position   Stairs Stairs: Yes Stairs assistance: Min guard Stair Management: No rails;Step to pattern;Backwards;With walker Number of Stairs: 1 General stair comments: cues for sequence   Wheelchair Mobility    Modified Rankin (Stroke Patients Only)       Balance                                            Cognition Arousal/Alertness: Awake/alert Behavior During Therapy: WFL for tasks assessed/performed Overall Cognitive Status: Within Functional Limits for tasks assessed                                         Exercises Total Joint Exercises Ankle Circles/Pumps: AROM;Both;10 reps Long Arc Quad: AROM;Right;5 reps    General Comments        Pertinent Vitals/Pain Pain Assessment: 0-10 Pain Score: 4  Pain Location: right hip  Pain Descriptors / Indicators: Sore;Tightness Pain Intervention(s): Limited activity within patient's tolerance;Monitored during session;Premedicated before session;Repositioned;Ice applied    Home Living Family/patient expects to be discharged to:: Private residence Living Arrangements: Spouse/significant other Available Help at Discharge: Family Type of Home: House Home Access: Stairs to enter   Home Layout: One level Home Equipment: None 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    Prior Function Level of Independence: Independent          PT Goals (current goals can now be found in the care plan section) Acute Rehab PT Goals Patient Stated Goal: get back to work PT Goal Formulation: With patient Time For Goal Achievement: 11/08/17 Potential to Achieve Goals: Good Progress towards PT goals: Progressing toward goals    Frequency    7X/week      PT Plan Current plan remains appropriate    Co-evaluation              AM-PAC PT "6 Clicks" Daily Activity  Outcome Measure  Difficulty turning over in bed (including adjusting  bedclothes, sheets and blankets)?: Unable Difficulty moving from lying on back to sitting on the side of the bed? : A Little Difficulty sitting down on and standing up from a chair with arms (e.g., wheelchair, bedside commode, etc,.)?: A Little Help needed moving to and from a bed to chair (including a wheelchair)?: A Little Help needed walking in hospital room?: A Little Help needed climbing 3-5 steps with a railing? : A Little 6 Click Score: 16    End of Session Equipment Utilized During Treatment: Gait belt Activity Tolerance: Patient tolerated treatment  well Patient left: in chair;with call bell/phone within reach;with family/visitor present;with chair alarm set Nurse Communication: Mobility status PT Visit Diagnosis: Difficulty in walking, not elsewhere classified (R26.2)     Time: 5852-7782 PT Time Calculation (min) (ACUTE ONLY): 32 min  Charges:  $Gait Training: 8-22 mins                    G CodesKenyon Maynard, PT Pager: [phone] 11/02/2017    Rodney Maynard 11/02/2017, 1:28 PM

## 2017-11-02 NOTE — Progress Notes (Signed)
Occupational Therapy Evaluation Patient Details Name: Rodney KENYON Sr. MRN: [id number] DOB: 1956/02/18 Today's Date: 11/02/2017    History of Present Illness s/p R  DATHA   Clinical Impression   All OT education completed and pt questions answered. No further OT needs at this time. Will sign off.    Follow Up Recommendations  No OT follow up    Equipment Recommendations  3 in 1 bedside commode    Recommendations for Other Services       Precautions / Restrictions Precautions Precautions: Fall Restrictions Weight Bearing Restrictions: No Other Position/Activity Restrictions: WBAT      Mobility Bed Mobility               General bed mobility comments: up in recliner  Transfers Overall transfer level: Needs assistance Equipment used: Rolling walker (2 wheeled) Transfers: Sit to/from Stand Sit to Stand: Min guard         General transfer comment: cues for hand placement and RLE position    Balance                                           ADL either performed or assessed with clinical judgement   ADL Overall ADL's : Needs assistance/impaired Eating/Feeding: Independent   Grooming: Wash/dry hands;Min guard;Standing           Upper Body Dressing : Set up   Lower Body Dressing: Minimal assistance;Sit to/from stand   Toilet Transfer: Min guard;Ambulation;BSC;RW   Toileting- Water quality scientist and Hygiene: Min guard;Sit to/from stand   Tub/ Banker: Walk-in shower;Anterior/posterior;Rolling walker   Functional mobility during ADLs: Min guard;Rolling walker       Vision         Perception     Praxis      Pertinent Vitals/Pain Pain Assessment: 0-10 Pain Score: 6  Pain Location: right hip  Pain Descriptors / Indicators: Sore Pain Intervention(s): Limited activity within patient's tolerance;Monitored during session;RN gave pain meds during session     Hand Dominance     Extremity/Trunk Assessment  Upper Extremity Assessment Upper Extremity Assessment: Overall WFL for tasks assessed   Lower Extremity Assessment Lower Extremity Assessment: Defer to PT evaluation       Communication Communication Communication: No difficulties   Cognition Arousal/Alertness: Awake/alert Behavior During Therapy: WFL for tasks assessed/performed Overall Cognitive Status: Within Functional Limits for tasks assessed                                     General Comments       Exercises     Shoulder Instructions      Home Living Family/patient expects to be discharged to:: Private residence Living Arrangements: Spouse/significant other Available Help at Discharge: Family Type of Home: House Home Access: Stairs to enter Technical brewer of Steps: 6" step   Home Layout: One level     Bathroom Shower/Tub: Teacher, Rodney Maynard years/pre: Handicapped height Bathroom Accessibility: Yes How Accessible: Accessible via walker Home Equipment: None   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      Prior Functioning/Environment Level of Independence: Independent                 OT Problem List: Decreased strength;Decreased range of motion;Decreased knowledge of use of DME or AE;Pain  OT Treatment/Interventions:      OT Goals(Current goals can be found in the care plan section) Acute Rehab OT Goals Patient Stated Goal: get back to work OT Goal Formulation: All assessment and education complete, DC therapy  OT Frequency:     Barriers to D/C:            Co-evaluation              AM-PAC PT "6 Clicks" Daily Activity     Outcome Measure Help from another person eating meals?: None Help from another person taking care of personal grooming?: Maynard Little Help from another person toileting, which includes using toliet, bedpan, or urinal?: Maynard Little Help from another person bathing (including washing, rinsing, drying)?: Maynard Little Help from another  person to put on and taking off regular upper body clothing?: None Help from another person to put on and taking off regular lower body clothing?: Maynard Little 6 Click Score: 20   End of Session    Activity Tolerance:   Patient left:    OT Visit Diagnosis: Other abnormalities of gait and mobility (R26.89)                Time: 1018-1050 OT Time Calculation (min): 32 min Charges:  OT General Charges $OT Visit: 1 Visit OT Evaluation $OT Eval Low Complexity: 1 Low OT Treatments $Self Care/Home Management : 8-22 mins G-Codes:       Rodney Maynard Aaleah Hirsch 2017-11-22, 12:57 PM

## 2017-11-02 NOTE — Progress Notes (Signed)
Subjective: 1 Day Post-Op Procedure(s) (LRB): RIGHT TOTAL HIP ARTHROPLASTY ANTERIOR APPROACH (Right) Patient reports pain as moderate.  Working well with therapy.  Objective: Vital signs in last 24 hours: Temp:  [97.5 F (36.4 C)-98.6 F (37 C)] 98.4 F (36.9 C) (07/13 0955) Pulse Rate:  [51-79] 56 (07/13 0955) Resp:  [14-17] 17 (07/13 0955) BP: (130-151)/(77-88) 132/77 (07/13 0955) SpO2:  [96 %-100 %] 100 % (07/13 0955)  Intake/Output from previous day: 07/12 0701 - 07/13 0700 In: 3101.3 [P.O.:480; I.V.:2361.3; IV Piggyback:260] Out: 1650 [Urine:1500; Blood:150] Intake/Output this shift: Total I/O In: 485 [P.O.:240; I.V.:245] Out: -   Recent Labs    11/02/17 0928  HGB 12.1*   Recent Labs    11/02/17 0928  WBC 12.0*  RBC 3.88*  HCT 35.2*  PLT 242   Recent Labs    11/02/17 0459  NA 138  K 3.9  CL 103  CO2 27  BUN 18  CREATININE 1.13  GLUCOSE 178*  CALCIUM 8.7*   No results for input(s): LABPT, INR in the last 72 hours.  Sensation intact distally Intact pulses distally Dorsiflexion/Plantar flexion intact Incision: dressing C/D/I   Assessment/Plan: 1 Day Post-Op Procedure(s) (LRB): RIGHT TOTAL HIP ARTHROPLASTY ANTERIOR APPROACH (Right) Up with therapy Discharge home with home health this afternoon.    Mcarthur Rossetti 11/02/2017, 10:26 AM

## 2017-11-02 NOTE — Discharge Summary (Signed)
Patient ID: Rodney Jacks Sr. MRN: [id number] DOB/AGE: [birth year] 62 y.o.  Admit date: 11/01/2017 Discharge date: 11/02/2017  Admission Diagnoses:  Principal Problem:   Primary osteoarthritis of right hip Active Problems:   Status post total replacement of right hip   Discharge Diagnoses:  Same  Past Medical History:  Diagnosis Date  . Abnormal prostate biopsy 2012  . Arthritis    arthritis back-fingers, disc disease degenerative  with sciatic  pain both legs right > left.  . Bilateral hydrocele   . Bilateral renal cysts   . Cancer [facility])    prostate cancer, bx. 7'15, 2012 bx- was obseved until PSA showed steady increase.  Marland Kitchen Epididymal cyst    Bilateral head  . Heart murmur    pt denies  . High cholesterol   . Hypertension   . Sciatica   . Testicular cyst    Right  . Umbilical hernia   . Wheezing    Occ.    Surgeries: Procedure(s): RIGHT TOTAL HIP ARTHROPLASTY ANTERIOR APPROACH on 11/01/2017   Consultants:   Discharged Condition: Improved  Hospital Course: Shaydon Lease. is an 62 y.o. male who was admitted 11/01/2017 for operative treatment ofPrimary osteoarthritis of right hip. Patient has severe unremitting pain that affects sleep, daily activities, and work/hobbies. After pre-op clearance the patient was taken to the operating room on 11/01/2017 and underwent  Procedure(s): RIGHT TOTAL HIP ARTHROPLASTY ANTERIOR APPROACH.    Patient was given perioperative antibiotics:  Anti-infectives (From admission, onward)   Start     Dose/Rate Route Frequency Ordered Stop   11/01/17 1400  ceFAZolin (ANCEF) IVPB 1 g/50 mL premix     1 g 100 mL/hr over 30 Minutes Intravenous Every 6 hours 11/01/17 1020 11/01/17 2221   11/01/17 0645  ceFAZolin (ANCEF) IVPB 2g/100 mL premix     2 g 200 mL/hr over 30 Minutes Intravenous On call to O.R. 11/01/17 5188 11/01/17 0737   11/01/17 0600  ceFAZolin (ANCEF) 3 g in dextrose 5 % 50 mL IVPB  Status:  Discontinued     3 g 100  mL/hr over 30 Minutes Intravenous On call to O.R. 10/31/17 1048 11/01/17 4166       Patient was given sequential compression devices, early ambulation, and chemoprophylaxis to prevent DVT.  Patient benefited maximally from hospital stay and there were no complications.    Recent vital signs:  Patient Vitals for the past 24 hrs:  BP Temp Temp src Pulse Resp SpO2  11/02/17 0955 132/77 98.4 F (36.9 C) Oral (!) 56 17 100 %  11/02/17 0454 (!) 144/86 98.4 F (36.9 C) Oral 79 16 98 %  11/02/17 0121 140/78 98.6 F (37 C) Oral 76 16 97 %  11/01/17 2150 (!) 145/84 98.4 F (36.9 C) Oral 79 16 96 %  11/01/17 1809 (!) 151/81 98.2 F (36.8 C) Oral (!) 59 16 96 %  11/01/17 1309 135/87 97.8 F (36.6 C) Oral 61 14 99 %  11/01/17 1206 133/88 (!) 97.5 F (36.4 C) Oral (!) 58 16 98 %  11/01/17 1108 130/79 97.7 F (36.5 C) Oral (!) 51 16 100 %     Recent laboratory studies:  Recent Labs    11/02/17 0459 11/02/17 0928  WBC  --  12.0*  HGB  --  12.1*  HCT  --  35.2*  PLT  --  242  NA 138  --   K 3.9  --   CL 103  --   CO2 27  --  BUN 18  --   CREATININE 1.13  --   GLUCOSE 178*  --   CALCIUM 8.7*  --      Discharge Medications:   Allergies as of 11/02/2017      Reactions   Latex Itching      Medication List    TAKE these medications   amLODipine 10 MG tablet Commonly known as:  NORVASC Take 10 mg by mouth every morning.   aspirin 81 MG chewable tablet Chew 1 tablet (81 mg total) by mouth 2 (two) times daily.   FISH OIL PO Take 520 mg by mouth daily.   gabapentin 300 MG capsule Commonly known as:  NEURONTIN Take 300 mg by mouth 3 (three) times daily.   indomethacin 75 MG CR capsule Commonly known as:  INDOCIN SR Take 75 mg by mouth daily with breakfast.   lisinopril-hydrochlorothiazide 20-25 MG tablet Commonly known as:  PRINZIDE,ZESTORETIC Take 1 tablet by mouth every morning.   methocarbamol 500 MG tablet Commonly known as:  ROBAXIN Take 1 tablet (500 mg  total) by mouth every 6 (six) hours as needed for muscle spasms.   metoprolol 200 MG 24 hr tablet Commonly known as:  TOPROL-XL Take 100 mg by mouth 2 (two) times daily.   naproxen sodium 220 MG tablet Commonly known as:  ALEVE Take 220 mg by mouth at bedtime as needed (pain).   oxyCODONE 5 MG immediate release tablet Commonly known as:  Oxy IR/ROXICODONE Take 1-2 tablets (5-10 mg total) by mouth every 4 (four) hours as needed for moderate pain (pain score 4-6).   VASELINE Gel Generic drug:  white petrolatum Apply 1 application topically as needed (itching).   ZZZQUIL 50 MG/30ML Liqd Generic drug:  diphenhydrAMINE HCl Take 25 mg by mouth at bedtime as needed (sleep).            Durable Medical Equipment  (From admission, onward)        Start     Ordered   11/01/17 1021  DME Walker rolling  Once    Question:  Patient needs a walker to treat with the following condition  Answer:  Status post total replacement of right hip   11/01/17 1020   11/01/17 1021  DME 3 n 1  Once     11/01/17 1020      Diagnostic Studies: Dg Pelvis Portable  Result Date: 11/01/2017 CLINICAL DATA:  Status post right hip replacement. EXAM: PORTABLE PELVIS 1-2 VIEWS COMPARISON:  Radiographs of April 26, 2017. FINDINGS: The right femoral and acetabular components appear to be well situated. Expected postoperative changes are seen in the surrounding soft tissues. No fracture or dislocation is noted. IMPRESSION: Status post right total hip arthroplasty. These results will be called to the ordering clinician or representative by the Radiologist Assistant, and communication documented in the PACS or zVision Dashboard. Electronically Signed   By: Marijo Conception, M.D.   On: 11/01/2017 09:57   Dg C-arm 1-60 Min-no Report  Result Date: 11/01/2017 Fluoroscopy was utilized by the requesting physician.  No radiographic interpretation.   Dg Hip Operative Unilat W Or W/o Pelvis Right  Result Date:  11/01/2017 CLINICAL DATA:  Images for total right hip replacement EXAM: OPERATIVE RIGHT HIP (WITH PELVIS IF PERFORMED) 7 VIEWS TECHNIQUE: Fluoroscopic spot image(s) were submitted for interpretation post-operatively. COMPARISON:  Plain film of the pelvis and RIGHT hip dated 09/19/2015. FINDINGS: Fluoroscopic spot images show placement of RIGHT hip arthroplasty hardware. Hardware appears appropriately positioned. Osseous alignment is  anatomic. Fluoroscopy was provided for 17 seconds. IMPRESSION: Intraoperative fluoroscopic images demonstrating placement of RIGHT hip arthroplasty hardware. No evidence of surgical complicating feature. Electronically Signed   By: Franki Cabot M.D.   On: 11/01/2017 08:56    Disposition: Discharge disposition: 01-Home or Self Care       Discharge Instructions    Discharge patient   Complete by:  As directed    Discharge disposition:  01-Home or Self Care   Discharge patient date:  11/02/2017      Follow-up Information    Mcarthur Rossetti, MD Follow up in 2 week(s).   Specialty:  Orthopedic Surgery Contact information: [place] [address] [phone]            Signed: Mcarthur Rossetti 11/02/2017, 10:29 AM

## 2017-11-02 NOTE — Discharge Instructions (Signed)

## 2017-11-04 DIAGNOSIS — Z8546 Personal history of malignant neoplasm of prostate: Secondary | ICD-10-CM | POA: Diagnosis not present

## 2017-11-04 DIAGNOSIS — Z471 Aftercare following joint replacement surgery: Secondary | ICD-10-CM | POA: Diagnosis not present

## 2017-11-04 DIAGNOSIS — Z96641 Presence of right artificial hip joint: Secondary | ICD-10-CM | POA: Diagnosis not present

## 2017-11-04 DIAGNOSIS — I10 Essential (primary) hypertension: Secondary | ICD-10-CM | POA: Diagnosis not present

## 2017-11-07 ENCOUNTER — Telehealth (INDEPENDENT_AMBULATORY_CARE_PROVIDER_SITE_OTHER): Payer: Self-pay

## 2017-11-07 ENCOUNTER — Telehealth (INDEPENDENT_AMBULATORY_CARE_PROVIDER_SITE_OTHER): Payer: Self-pay | Admitting: Orthopaedic Surgery

## 2017-11-07 NOTE — Telephone Encounter (Signed)
I left message on patient's vm that [facility] called today to get orders for PT - they will be contacting him.   Please see the original note and advise on the swelling.

## 2017-11-07 NOTE — Telephone Encounter (Signed)
He should wear his compressive hose for the swelling as well as elevate his leg and foot.

## 2017-11-07 NOTE — Telephone Encounter (Signed)
Rodney Maynard from [facility] called to request VO for the following:  2x a week for 2 weeks.  CB#[phone].  Thank you.

## 2017-11-07 NOTE — Telephone Encounter (Signed)
Call was transferred from voicemail from front desk.  Pt is s/p a total hip replacement last week. The ot is home and states that he has not heard from physical therapy. Also states that his foot and ankle on the surgical side are swollen and would like instruction on what to do.

## 2017-11-07 NOTE — Telephone Encounter (Signed)
IC and advised ok

## 2017-11-08 DIAGNOSIS — Z471 Aftercare following joint replacement surgery: Secondary | ICD-10-CM | POA: Diagnosis not present

## 2017-11-08 DIAGNOSIS — Z8546 Personal history of malignant neoplasm of prostate: Secondary | ICD-10-CM | POA: Diagnosis not present

## 2017-11-08 DIAGNOSIS — Z96641 Presence of right artificial hip joint: Secondary | ICD-10-CM | POA: Diagnosis not present

## 2017-11-08 DIAGNOSIS — I10 Essential (primary) hypertension: Secondary | ICD-10-CM | POA: Diagnosis not present

## 2017-11-08 NOTE — Telephone Encounter (Signed)
Patient advised on elevating his leg/foot and wearing his compression stocking.  He said [facility] came out today for PT, and the session "went well."

## 2017-11-11 DIAGNOSIS — Z471 Aftercare following joint replacement surgery: Secondary | ICD-10-CM | POA: Diagnosis not present

## 2017-11-11 DIAGNOSIS — Z96641 Presence of right artificial hip joint: Secondary | ICD-10-CM | POA: Diagnosis not present

## 2017-11-11 DIAGNOSIS — Z8546 Personal history of malignant neoplasm of prostate: Secondary | ICD-10-CM | POA: Diagnosis not present

## 2017-11-11 DIAGNOSIS — I10 Essential (primary) hypertension: Secondary | ICD-10-CM | POA: Diagnosis not present

## 2017-11-13 DIAGNOSIS — I10 Essential (primary) hypertension: Secondary | ICD-10-CM | POA: Diagnosis not present

## 2017-11-13 DIAGNOSIS — Z96641 Presence of right artificial hip joint: Secondary | ICD-10-CM | POA: Diagnosis not present

## 2017-11-13 DIAGNOSIS — Z8546 Personal history of malignant neoplasm of prostate: Secondary | ICD-10-CM | POA: Diagnosis not present

## 2017-11-13 DIAGNOSIS — Z471 Aftercare following joint replacement surgery: Secondary | ICD-10-CM | POA: Diagnosis not present

## 2017-11-18 ENCOUNTER — Ambulatory Visit (INDEPENDENT_AMBULATORY_CARE_PROVIDER_SITE_OTHER): Payer: Medicare HMO | Admitting: Orthopaedic Surgery

## 2017-11-18 ENCOUNTER — Encounter (INDEPENDENT_AMBULATORY_CARE_PROVIDER_SITE_OTHER): Payer: Self-pay | Admitting: Orthopaedic Surgery

## 2017-11-18 DIAGNOSIS — Z96641 Presence of right artificial hip joint: Secondary | ICD-10-CM

## 2017-11-18 MED ORDER — OXYCODONE HCL 5 MG PO TABS: 5.0000 mg | ORAL_TABLET | ORAL | 0 refills | Status: DC | PRN

## 2017-11-18 MED ORDER — OXYCODONE HCL 5 MG PO TABS: 5.0000 mg | ORAL_TABLET | ORAL | 0 refills | Status: AC | PRN

## 2017-11-18 NOTE — Progress Notes (Signed)
The patient is following up 2 weeks status post a right total hip arthroplasty.  He is ambulate with a cane and doing well overall.  On examination his right hip incision looks good.  Remove the old Steri-Strips in place new Steri-Strips.  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  His right hip is definitely longer with the right lower extremity longer than the left side.  I assessed his intraoperative and postoperative films as well as his preoperative films.  He was always a little shorter on his left nonoperative side but is definitely shoulder.  I reviewed my operative note and we need to do this for stability purposes.  I talked to him in detail about this.  We will see him back in 4 weeks to see how is doing overall.  I would like to that point probably evaluate him for a shoe lift.  At the next visit I would like a standing low AP pelvis.

## 2017-11-21 ENCOUNTER — Telehealth (INDEPENDENT_AMBULATORY_CARE_PROVIDER_SITE_OTHER): Payer: Self-pay | Admitting: Orthopaedic Surgery

## 2017-11-21 NOTE — Telephone Encounter (Signed)
Patient state that went to [facility] to get prescription and was told that Dr Ninfa Linden needed to call in something else. They could not fill the Oxycodone.  Please call patient to advise.  [phone]

## 2017-11-21 NOTE — Telephone Encounter (Signed)
Pharmacy just needed clarification that insurance will only cover 6 tabs per day, I advised that this is fine

## 2017-11-27 ENCOUNTER — Other Ambulatory Visit (INDEPENDENT_AMBULATORY_CARE_PROVIDER_SITE_OTHER): Payer: Self-pay | Admitting: Orthopaedic Surgery

## 2017-11-28 ENCOUNTER — Other Ambulatory Visit (INDEPENDENT_AMBULATORY_CARE_PROVIDER_SITE_OTHER): Payer: Self-pay | Admitting: Orthopaedic Surgery

## 2017-12-16 ENCOUNTER — Ambulatory Visit (INDEPENDENT_AMBULATORY_CARE_PROVIDER_SITE_OTHER): Payer: Medicare HMO | Admitting: Orthopaedic Surgery

## 2017-12-16 ENCOUNTER — Encounter (INDEPENDENT_AMBULATORY_CARE_PROVIDER_SITE_OTHER): Payer: Self-pay | Admitting: Orthopaedic Surgery

## 2017-12-16 ENCOUNTER — Ambulatory Visit (INDEPENDENT_AMBULATORY_CARE_PROVIDER_SITE_OTHER): Payer: Medicare HMO

## 2017-12-16 DIAGNOSIS — Z96641 Presence of right artificial hip joint: Secondary | ICD-10-CM

## 2017-12-16 NOTE — Progress Notes (Signed)
The patient is about 50 days status post a right total hip arthroplasty he says he feels wonderful in terms of his hip.  He has been wearing an insert that he got from his wife that he puts in his left shoe due to leg length discrepancy and he said that his balance and outputs taken away any back pain from them as well.  He is still using a cane but says he feels great.  On examination of his right hip I can put him through full internal and external rotation with no pain at all.  I did have him lay supine and he does have a ligament discrepancy with his right operative side slightly longer than the left.  This is also confirmed with plain films today that are standing from a well-seated implant but definitely leg length discrepancy.  At this point we will continue to insert since this is helping him.  I would like to see him back in 6 months with a repeat standing low AP pelvis.  All question concerns were answered and addressed.  He again says he is doing great.

## 2018-01-09 DIAGNOSIS — I10 Essential (primary) hypertension: Secondary | ICD-10-CM | POA: Diagnosis not present

## 2018-01-09 DIAGNOSIS — Z23 Encounter for immunization: Secondary | ICD-10-CM | POA: Diagnosis not present

## 2018-01-09 DIAGNOSIS — R7303 Prediabetes: Secondary | ICD-10-CM | POA: Diagnosis not present

## 2018-01-09 DIAGNOSIS — J309 Allergic rhinitis, unspecified: Secondary | ICD-10-CM | POA: Diagnosis not present

## 2018-01-09 DIAGNOSIS — C61 Malignant neoplasm of prostate: Secondary | ICD-10-CM | POA: Diagnosis not present

## 2018-01-09 DIAGNOSIS — M519 Unspecified thoracic, thoracolumbar and lumbosacral intervertebral disc disorder: Secondary | ICD-10-CM | POA: Diagnosis not present

## 2018-06-18 ENCOUNTER — Ambulatory Visit (INDEPENDENT_AMBULATORY_CARE_PROVIDER_SITE_OTHER): Payer: Medicare HMO | Admitting: Orthopaedic Surgery

## 2018-12-01 DIAGNOSIS — Z1389 Encounter for screening for other disorder: Secondary | ICD-10-CM | POA: Diagnosis not present

## 2018-12-01 DIAGNOSIS — E782 Mixed hyperlipidemia: Secondary | ICD-10-CM | POA: Diagnosis not present

## 2018-12-01 DIAGNOSIS — R7303 Prediabetes: Secondary | ICD-10-CM | POA: Diagnosis not present

## 2018-12-01 DIAGNOSIS — I10 Essential (primary) hypertension: Secondary | ICD-10-CM | POA: Diagnosis not present

## 2018-12-01 DIAGNOSIS — Z0000 Encounter for general adult medical examination without abnormal findings: Secondary | ICD-10-CM | POA: Diagnosis not present

## 2018-12-01 DIAGNOSIS — C61 Malignant neoplasm of prostate: Secondary | ICD-10-CM | POA: Diagnosis not present

## 2018-12-01 DIAGNOSIS — M519 Unspecified thoracic, thoracolumbar and lumbosacral intervertebral disc disorder: Secondary | ICD-10-CM | POA: Diagnosis not present

## 2018-12-01 DIAGNOSIS — M169 Osteoarthritis of hip, unspecified: Secondary | ICD-10-CM | POA: Diagnosis not present

## 2018-12-01 DIAGNOSIS — J309 Allergic rhinitis, unspecified: Secondary | ICD-10-CM | POA: Diagnosis not present

## 2018-12-03 DIAGNOSIS — H04123 Dry eye syndrome of bilateral lacrimal glands: Secondary | ICD-10-CM | POA: Diagnosis not present

## 2018-12-03 DIAGNOSIS — H0520 Unspecified exophthalmos: Secondary | ICD-10-CM | POA: Diagnosis not present

## 2018-12-03 DIAGNOSIS — H2513 Age-related nuclear cataract, bilateral: Secondary | ICD-10-CM | POA: Diagnosis not present

## 2018-12-03 DIAGNOSIS — H40013 Open angle with borderline findings, low risk, bilateral: Secondary | ICD-10-CM | POA: Diagnosis not present

## 2018-12-16 DIAGNOSIS — R7303 Prediabetes: Secondary | ICD-10-CM | POA: Diagnosis not present

## 2018-12-16 DIAGNOSIS — I10 Essential (primary) hypertension: Secondary | ICD-10-CM | POA: Diagnosis not present

## 2018-12-16 DIAGNOSIS — C61 Malignant neoplasm of prostate: Secondary | ICD-10-CM | POA: Diagnosis not present

## 2018-12-16 DIAGNOSIS — E782 Mixed hyperlipidemia: Secondary | ICD-10-CM | POA: Diagnosis not present

## 2019-01-05 DIAGNOSIS — C61 Malignant neoplasm of prostate: Secondary | ICD-10-CM | POA: Diagnosis not present

## 2019-01-05 DIAGNOSIS — N5231 Erectile dysfunction following radical prostatectomy: Secondary | ICD-10-CM | POA: Diagnosis not present

## 2019-01-07 DIAGNOSIS — R69 Illness, unspecified: Secondary | ICD-10-CM | POA: Diagnosis not present

## 2019-01-19 DIAGNOSIS — N5231 Erectile dysfunction following radical prostatectomy: Secondary | ICD-10-CM | POA: Diagnosis not present

## 2019-06-05 DIAGNOSIS — R7303 Prediabetes: Secondary | ICD-10-CM | POA: Diagnosis not present

## 2019-06-05 DIAGNOSIS — I10 Essential (primary) hypertension: Secondary | ICD-10-CM | POA: Diagnosis not present

## 2019-06-05 DIAGNOSIS — Z8546 Personal history of malignant neoplasm of prostate: Secondary | ICD-10-CM | POA: Diagnosis not present

## 2019-06-05 DIAGNOSIS — N529 Male erectile dysfunction, unspecified: Secondary | ICD-10-CM | POA: Diagnosis not present

## 2019-06-05 DIAGNOSIS — R21 Rash and other nonspecific skin eruption: Secondary | ICD-10-CM | POA: Diagnosis not present

## 2019-06-16 ENCOUNTER — Ambulatory Visit (INDEPENDENT_AMBULATORY_CARE_PROVIDER_SITE_OTHER): Payer: Medicare HMO

## 2019-06-16 ENCOUNTER — Ambulatory Visit: Payer: Medicare HMO | Admitting: Orthopaedic Surgery

## 2019-06-16 ENCOUNTER — Encounter: Payer: Self-pay | Admitting: Orthopaedic Surgery

## 2019-06-16 ENCOUNTER — Other Ambulatory Visit: Payer: Self-pay

## 2019-06-16 DIAGNOSIS — M25562 Pain in left knee: Secondary | ICD-10-CM

## 2019-06-16 MED ORDER — METHYLPREDNISOLONE ACETATE 40 MG/ML IJ SUSP
40.0000 mg | INTRAMUSCULAR | Status: AC | PRN
  Administered 2019-06-16: 40 mg via INTRA_ARTICULAR

## 2019-06-16 MED ORDER — LIDOCAINE HCL 1 % IJ SOLN
5.0000 mL | INTRAMUSCULAR | Status: AC | PRN
  Administered 2019-06-16: 5 mL

## 2019-06-16 NOTE — Progress Notes (Signed)
Office Visit Note   Patient: Rodney DALSANTO Sr.           Date of Birth: 1955/11/10           MRN: [id number] Visit Date: 06/16/2019              Requested by: [name], MD [address],  [place] [address] PCP: [name], MD   Assessment & Plan: Visit Diagnoses:  1. 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  He can continue ibuprofen.  Encouraged to continue icing the knee.  He is to be mindful of any mechanical symptoms of the knee and these are reviewed with him.  We will see him back in 2 weeks to see what type of response he had to the aspiration / injection.  Follow-Up Instructions: Return in about 2 weeks (around 06/30/2019).   Orders:  Orders Placed This Encounter  Procedures  . Large Joint Inj  . XR Knee 1-2 Views Left   No orders of the defined types were placed in this encounter.     Procedures: Large Joint Inj: L knee on 06/16/2019 9:39 AM Indications: pain Details: 22 G 1.5 in needle, superolateral approach  Arthrogram: No  Medications: 40 mg methylPREDNISolone acetate 40 MG/ML; 5 mL lidocaine 1 % Aspirate: 33 mL blood-tinged Outcome: tolerated well, no immediate complications Procedure, treatment alternatives, risks and benefits explained, specific risks discussed. Consent was given by the patient. Immediately prior to procedure a time out was called to verify the correct patient, procedure, equipment, support staff and site/side marked as required. Patient was prepped and draped in the usual sterile fashion.       Clinical Data: No additional findings.   Subjective: Chief Complaint  Patient presents with  . Left Knee - Pain    HPI Rodney Maynard is a pleasant 64 year old male well-known to Dr. Trevor Mace service.  History of right total hip arthroplasty 19 months ago doing well.  The was on a stepladder while cutting some branches off on 06/14/2019 and a branch hit him causing  fall off the ladder twisting his left knee.  He had no other injury.  He notes he has had some swelling in the knee.  He denies any mechanical symptoms.  He has tried icy hot and ibuprofen ice packs and elevation he continues to have tightness of the knee and pain along the medial aspect the knee.  He is also using a cane to ambulate which he was not using prior to this injury.  He denies any loss of consciousness or dizziness at the time of injury. Review of Systems Please see HPI otherwise negative or noncontributory.  Objective: Vital Signs: There were no vitals taken for this visit.  Physical Exam Constitutional:      Appearance: He is not ill-appearing or diaphoretic.  Pulmonary:     Effort: Pulmonary effort is normal.  Neurological:     Mental Status: He is alert and oriented to person, place, and time.  Psychiatric:        Mood and Affect: Mood normal.     Ortho Exam Left knee full range of motion however forced flexion causes discomfort.  Positive McMurray's.  Tender along medial joint line.  Positive effusion no abnormal warmth or erythema.  No instability valgus varus stressing.  No rashes skin lesions ulcerations or abrasions about the knee. Specialty Comments:  No specialty comments available.  Imaging: XR Knee 1-2 Views Left  Result Date: 06/16/2019 Left knee 2 views: No acute fracture.  Mild narrowing medial joint line.  No bony abnormalities.  Negative for subluxation or dislocation.    PMFS History: Patient Active Problem List   Diagnosis Date Noted  . Status post total replacement of right hip 11/01/2017  . Primary osteoarthritis of right hip 09/09/2017  . Essential hypertension 11/12/2016  . Pure hypercholesterolemia 11/12/2016  . Class 2 severe obesity due to excess calories with serious comorbidity and body mass index (BMI) of 37.0 to 37.9 in adult ([place]) 11/12/2016  . Malignant neoplasm of prostate ([place]) 01/13/2014   Past Medical History:  Diagnosis Date    . Abnormal prostate biopsy 2012  . Arthritis    arthritis back-fingers, disc disease degenerative  with sciatic  pain both legs right > left.  . Bilateral hydrocele   . Bilateral renal cysts   . Cancer [facility])    prostate cancer, bx. 7'15, 2012 bx- was obseved until PSA showed steady increase.  Marland Kitchen Epididymal cyst    Bilateral head  . Heart murmur    pt denies  . High cholesterol   . Hypertension   . Sciatica   . Testicular cyst    Right  . Umbilical hernia   . Wheezing    Occ.    History reviewed. No pertinent family history.  Past Surgical History:  Procedure Laterality Date  . CIRCUMCISION  1980  . COLONOSCOPY  2019  . INNER EAR SURGERY Right   . LYMPHADENECTOMY Bilateral 01/13/2014   Procedure: LYMPHADENECTOMY;  Surgeon: Bernestine Amass, MD;  Location: [facility];  Service: Urology;  Laterality: Bilateral;  . PROSTATE BIOPSY    . ROBOT ASSISTED LAPAROSCOPIC RADICAL PROSTATECTOMY N/A 01/13/2014   Procedure: ROBOTIC ASSISTED LAPAROSCOPIC RADICAL PROSTATECTOMY;  Surgeon: Bernestine Amass, MD;  Location: [facility];  Service: Urology;  Laterality: N/A;  . TONSILLECTOMY    . TOTAL HIP ARTHROPLASTY Right 11/01/2017   Procedure: RIGHT TOTAL HIP ARTHROPLASTY ANTERIOR APPROACH;  Surgeon: Mcarthur Rossetti, MD;  Location: [facility];  Service: Orthopedics;  Laterality: Right;   Social History   Occupational History  . Not on file  Tobacco Use  . Smoking status: Former Smoker    Quit date: 01/11/1974    Years since quitting: 45.4  . Smokeless tobacco: Never Used  Substance and Sexual Activity  . Alcohol use: Not Currently  . Drug use: No  . Sexual activity: Yes

## 2019-06-30 ENCOUNTER — Other Ambulatory Visit: Payer: Self-pay

## 2019-06-30 ENCOUNTER — Ambulatory Visit: Payer: Medicare HMO | Admitting: Orthopaedic Surgery

## 2019-06-30 ENCOUNTER — Encounter: Payer: Self-pay | Admitting: Orthopaedic Surgery

## 2019-06-30 DIAGNOSIS — M25562 Pain in left knee: Secondary | ICD-10-CM | POA: Diagnosis not present

## 2019-06-30 NOTE — Progress Notes (Signed)
The patient comes in today for follow-up 2 weeks out from having a knee aspirated and a steroid injection placed.  This was in his left knee.  This was after an acute fall off a stepladder when he is respiratory.  He says the knee is doing much better overall.  He still has a little bit of pain on the medial lateral aspects of the knee he states but overall there is no locking catching.  On exam he does have still just a mild effusion.  This is with exam in the left knee.  The range of motion is full in the knee feels ligamentously stable.  This point he can follow-up as needed since he is continuing to improve.  He does understand if he has any issues at all but I would want to see him back in the office to consider repeat aspiration and a steroid injection again if needed.  After that if he continues to have symptoms we would obtain an MRI of his knee.  All question concerns were answered and addressed.

## 2019-07-11 DIAGNOSIS — Z23 Encounter for immunization: Secondary | ICD-10-CM | POA: Diagnosis not present

## 2019-08-21 DIAGNOSIS — J4521 Mild intermittent asthma with (acute) exacerbation: Secondary | ICD-10-CM | POA: Diagnosis not present

## 2019-08-21 DIAGNOSIS — E782 Mixed hyperlipidemia: Secondary | ICD-10-CM | POA: Diagnosis not present

## 2019-08-21 DIAGNOSIS — M1990 Unspecified osteoarthritis, unspecified site: Secondary | ICD-10-CM | POA: Diagnosis not present

## 2019-08-21 DIAGNOSIS — Z8546 Personal history of malignant neoplasm of prostate: Secondary | ICD-10-CM | POA: Diagnosis not present

## 2019-08-21 DIAGNOSIS — M169 Osteoarthritis of hip, unspecified: Secondary | ICD-10-CM | POA: Diagnosis not present

## 2019-08-21 DIAGNOSIS — I10 Essential (primary) hypertension: Secondary | ICD-10-CM | POA: Diagnosis not present

## 2019-08-21 DIAGNOSIS — N400 Enlarged prostate without lower urinary tract symptoms: Secondary | ICD-10-CM | POA: Diagnosis not present

## 2019-08-21 DIAGNOSIS — C61 Malignant neoplasm of prostate: Secondary | ICD-10-CM | POA: Diagnosis not present

## 2019-08-23 IMAGING — RF DG HIP (WITH PELVIS) OPERATIVE*R*
1 series · 7 of 7 positions shown · non-contrast
Comparison: Plain film of the pelvis and RIGHT hip dated
09/19/2015.

CLINICAL DATA: Images for total right hip replacement

EXAM:
OPERATIVE RIGHT HIP (WITH PELVIS IF PERFORMED) 7 VIEWS
TECHNIQUE: Fluoroscopic spot image(s) were submitted for interpretation
post-operatively.

[Series 1: unknown protocol · 0.20mm/px · 7 of 7 slices shown]
[im 1/7]
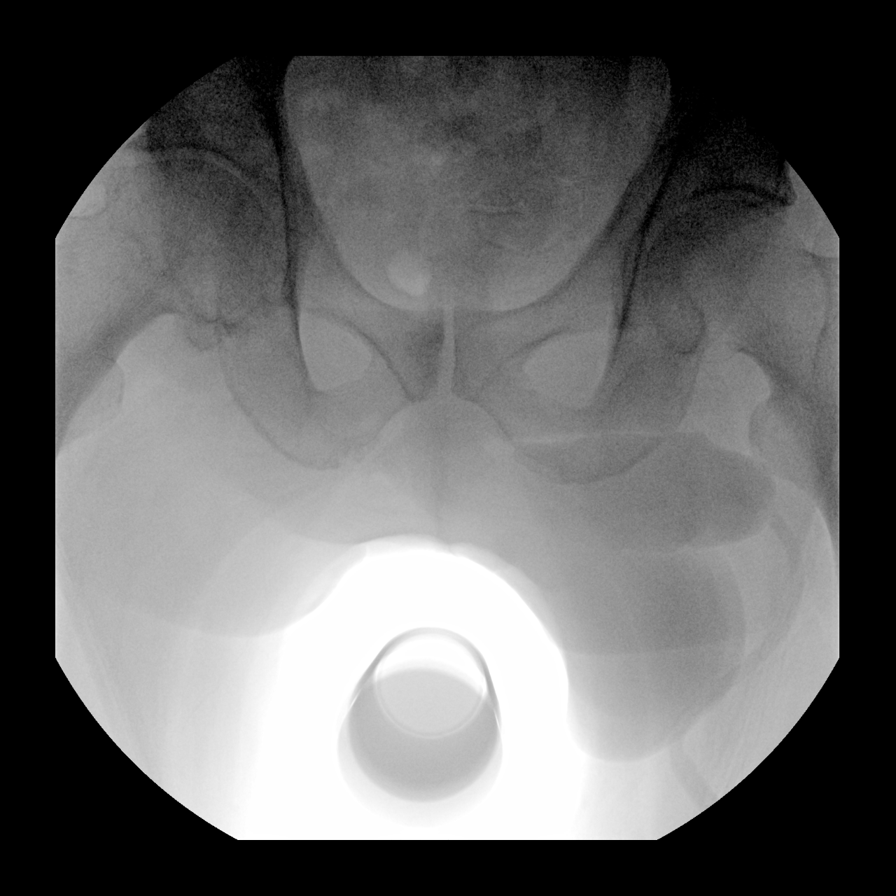
[im 2/7]
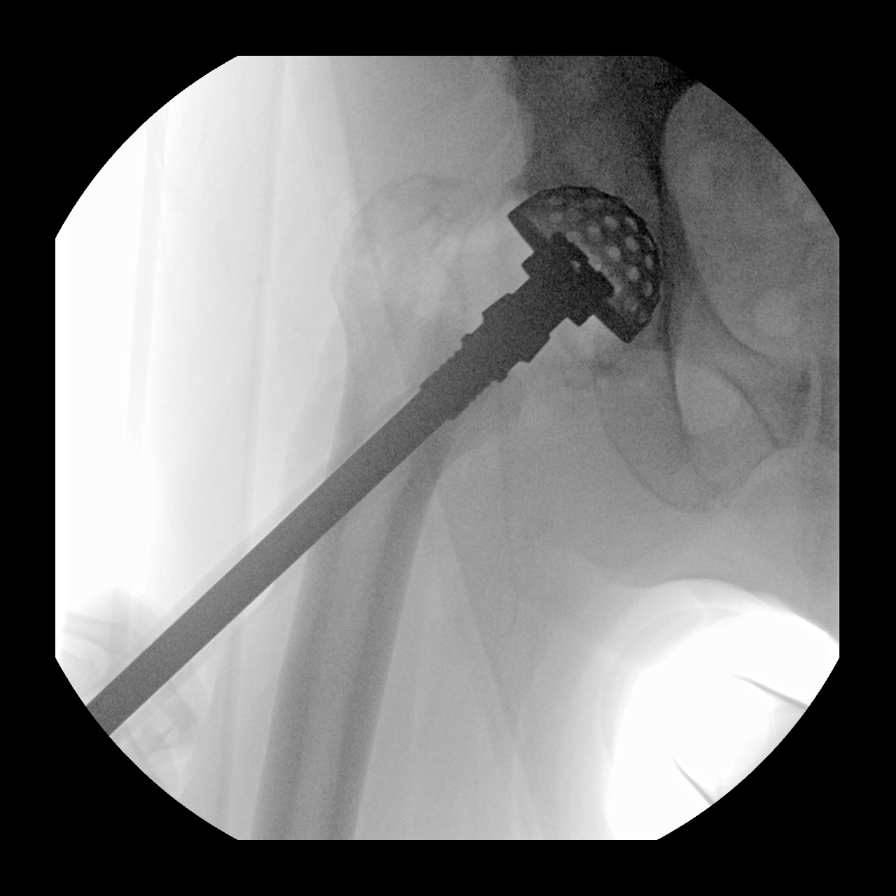
[im 3/7]
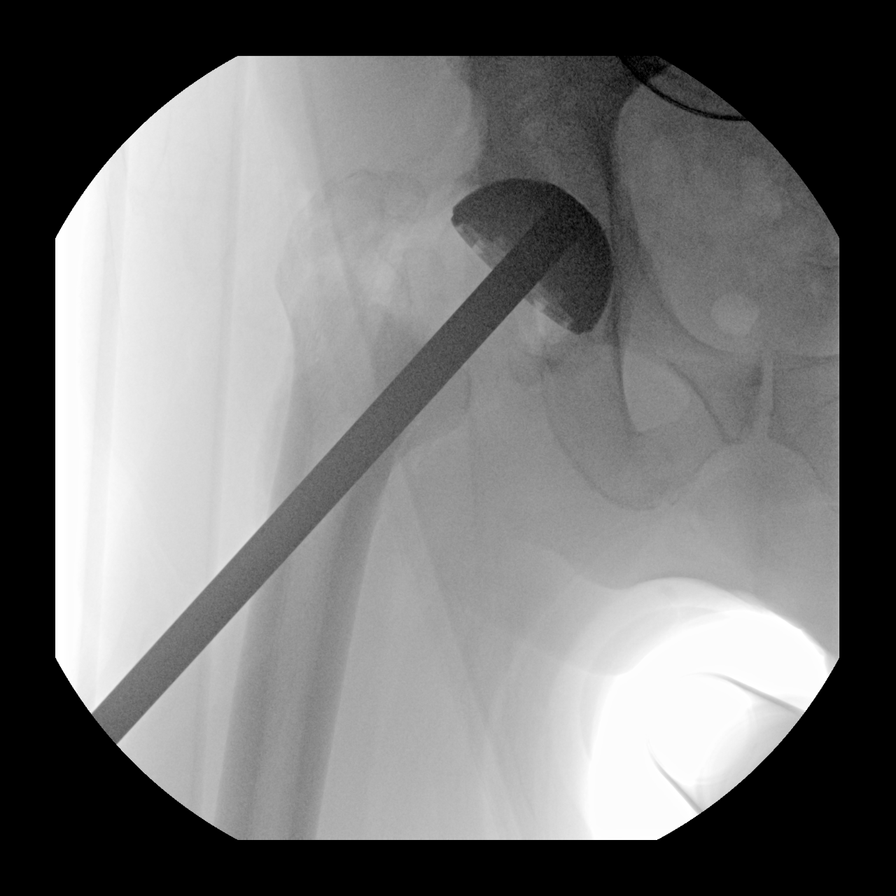
[im 4/7]
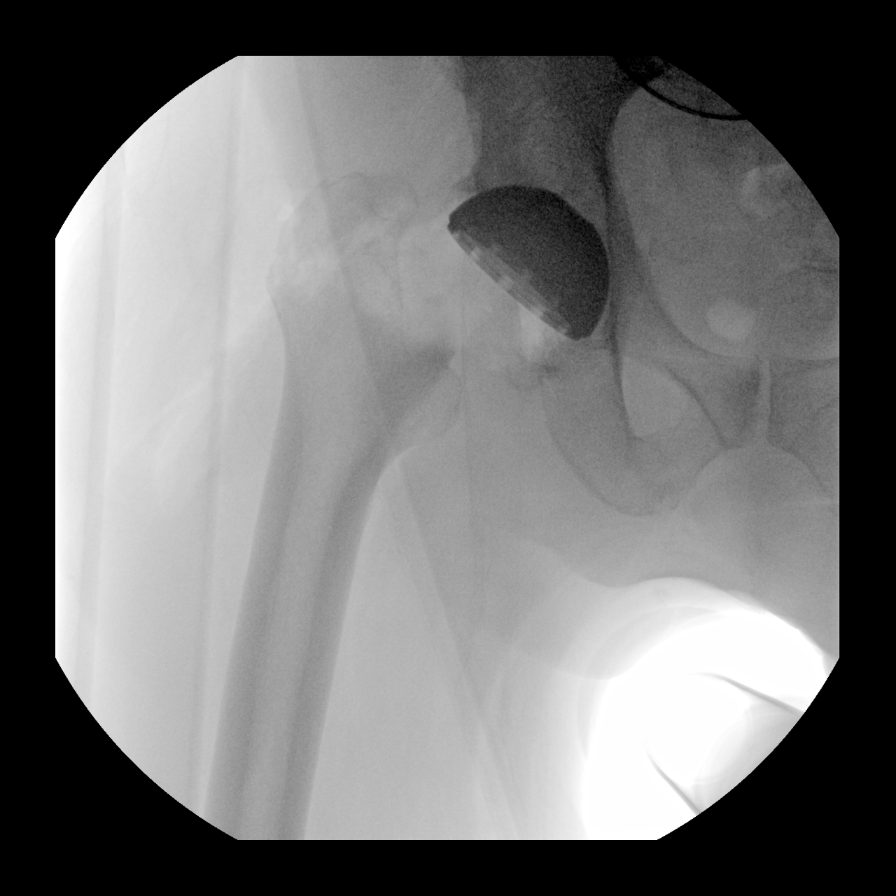
[im 5/7]
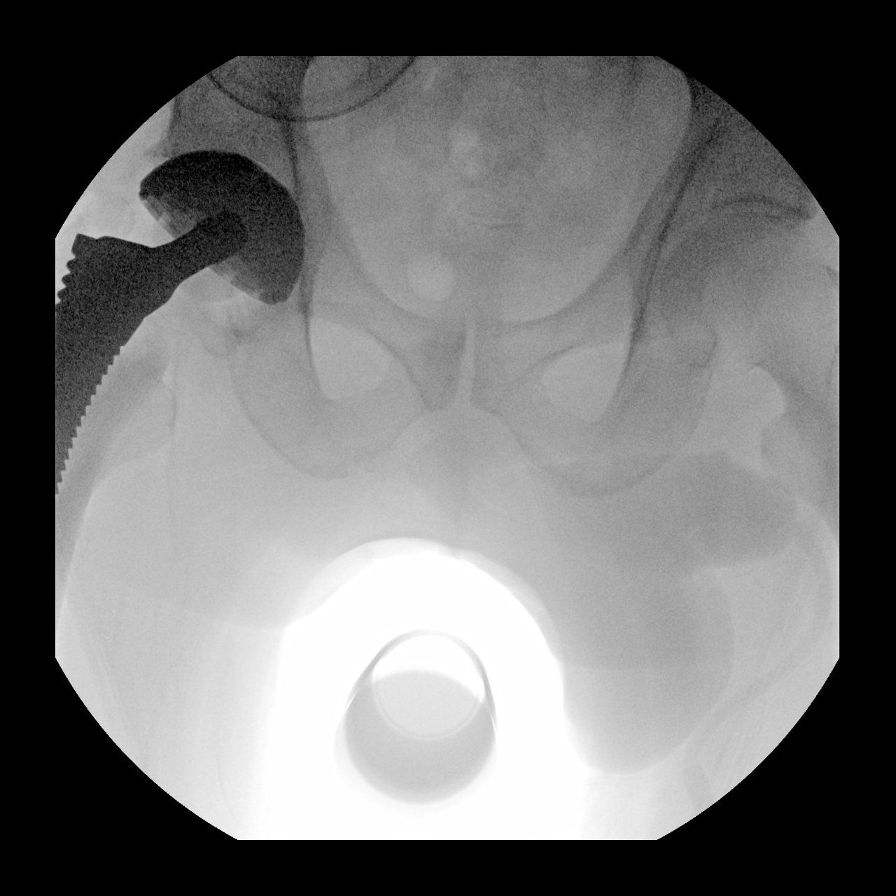
[im 6/7]
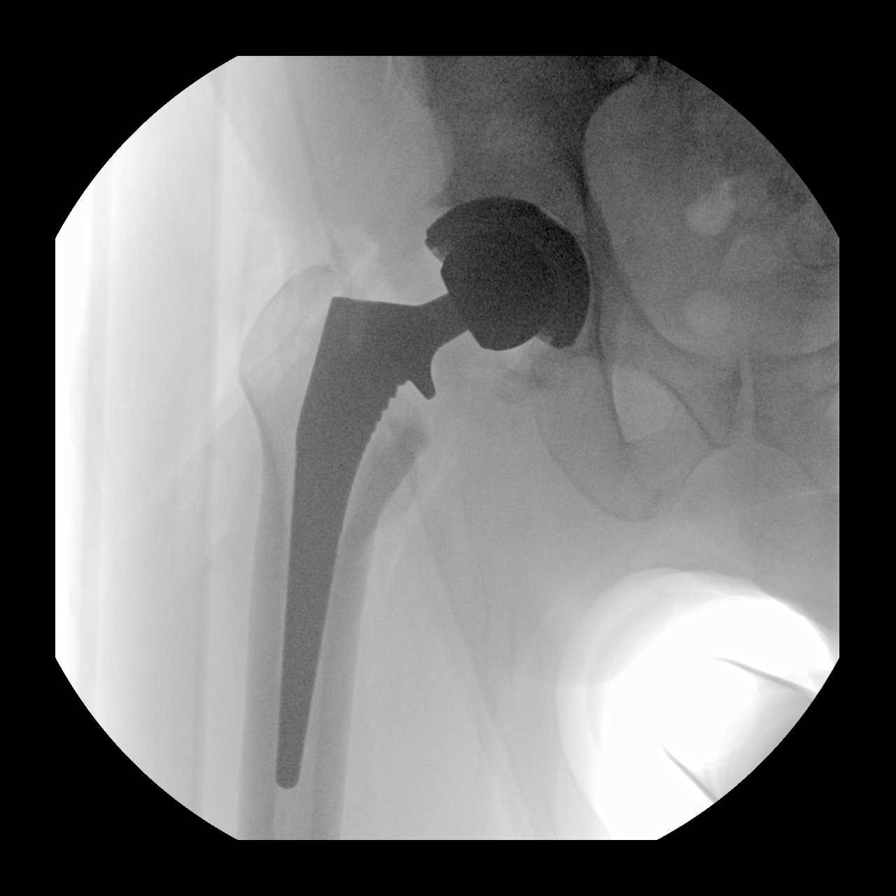
[im 7/7]
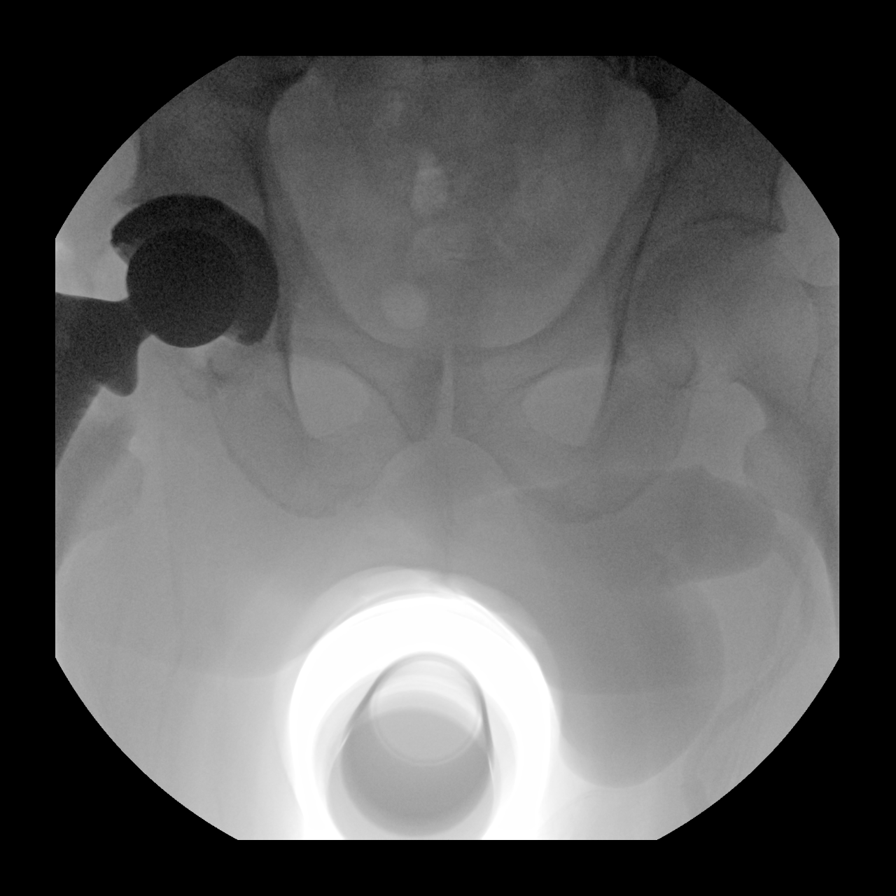

[7 of 7 positions shown; findings below may reference images not displayed]

FINDINGS: Fluoroscopic spot images show placement of RIGHT hip arthroplasty
hardware. Hardware appears appropriately positioned. Osseous
alignment is anatomic. Fluoroscopy was provided for 17 seconds.
IMPRESSION: Intraoperative fluoroscopic images demonstrating placement of RIGHT
hip arthroplasty hardware. No evidence of surgical complicating
feature.

## 2019-08-23 IMAGING — DX DG PORTABLE PELVIS
1 series · 1 of 1 positions shown · non-contrast
Comparison: Radiographs April 26, 2017.

CLINICAL DATA: Status post right hip replacement.

EXAM:
PORTABLE PELVIS 1-2 VIEWS

[pelvis ap]
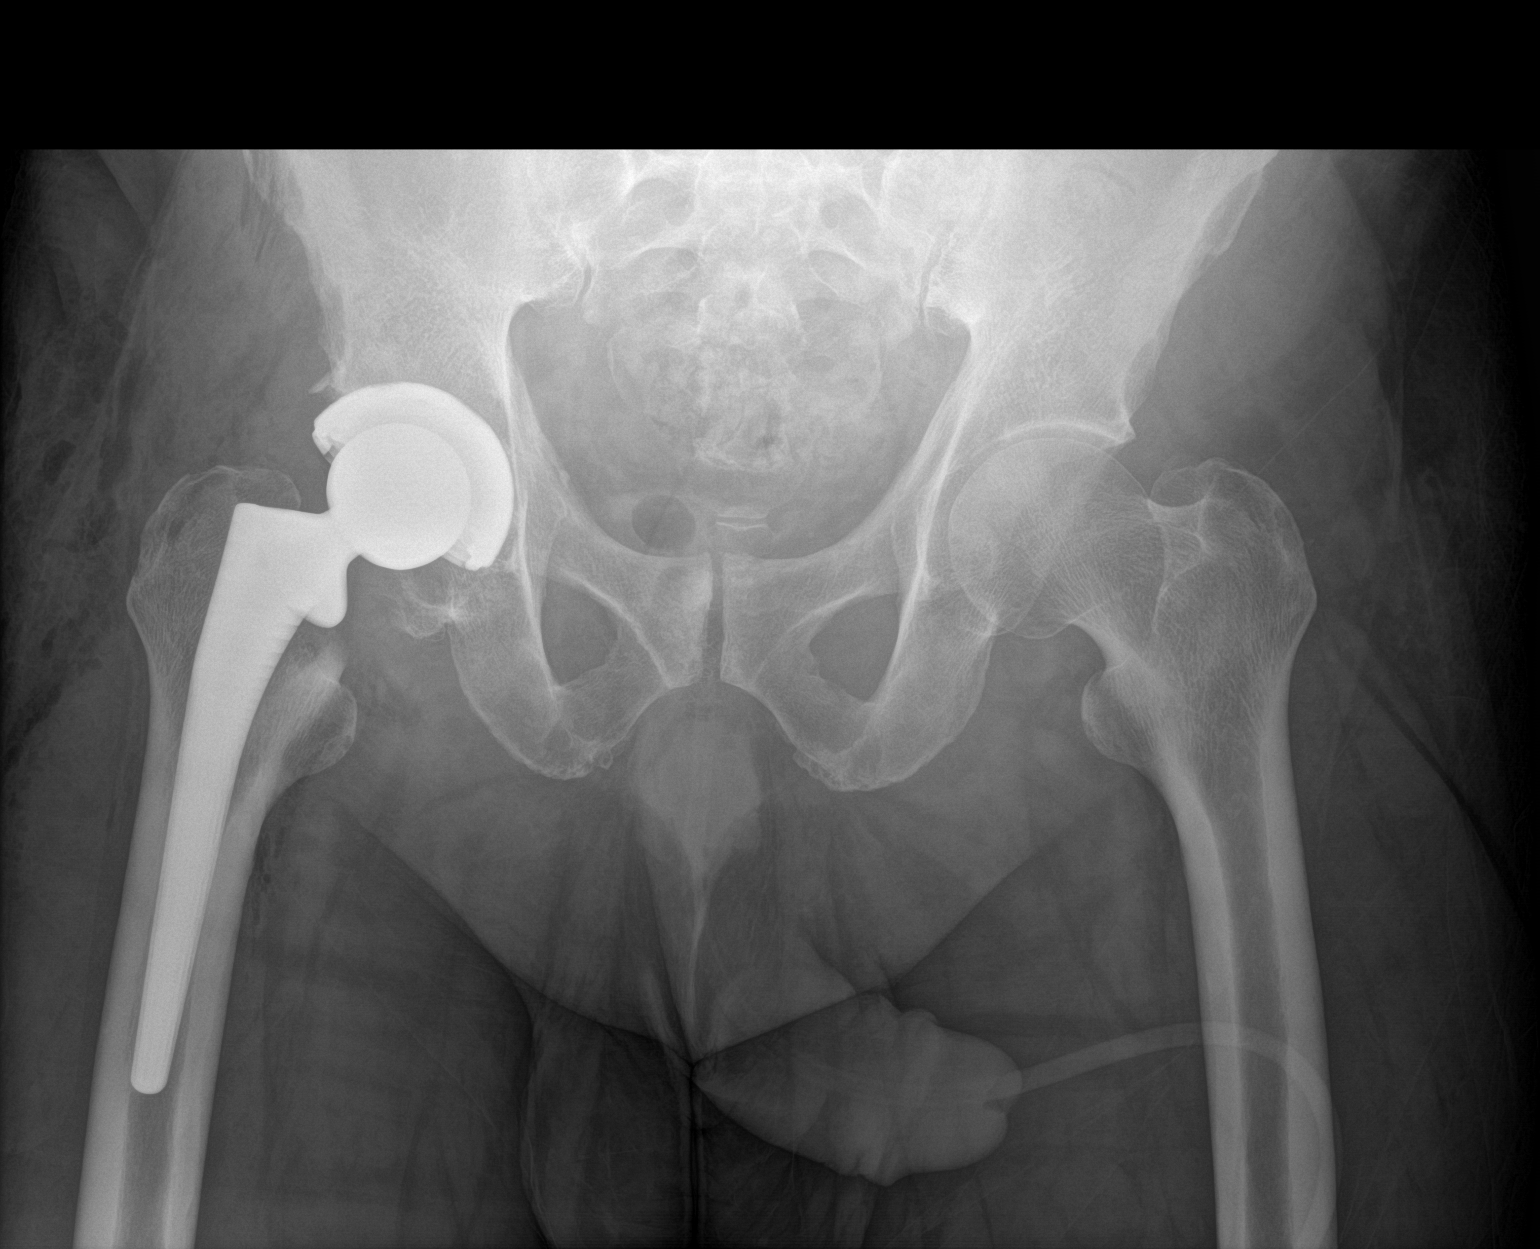

[1 of 1 positions shown; findings below may reference images not displayed]

FINDINGS: The right femoral and acetabular components appear to be well
situated. Expected postoperative changes are seen in the surrounding
soft tissues. No fracture or dislocation is noted.
IMPRESSION: Status post right total hip arthroplasty. These results will be
called to the ordering clinician or representative by the
Radiologist Assistant, and communication documented in the PACS or
zVision Dashboard.

## 2019-09-11 DIAGNOSIS — J4521 Mild intermittent asthma with (acute) exacerbation: Secondary | ICD-10-CM | POA: Diagnosis not present

## 2019-09-11 DIAGNOSIS — N400 Enlarged prostate without lower urinary tract symptoms: Secondary | ICD-10-CM | POA: Diagnosis not present

## 2019-09-11 DIAGNOSIS — I10 Essential (primary) hypertension: Secondary | ICD-10-CM | POA: Diagnosis not present

## 2019-09-11 DIAGNOSIS — Z8546 Personal history of malignant neoplasm of prostate: Secondary | ICD-10-CM | POA: Diagnosis not present

## 2019-09-11 DIAGNOSIS — M169 Osteoarthritis of hip, unspecified: Secondary | ICD-10-CM | POA: Diagnosis not present

## 2019-09-11 DIAGNOSIS — M1990 Unspecified osteoarthritis, unspecified site: Secondary | ICD-10-CM | POA: Diagnosis not present

## 2019-09-11 DIAGNOSIS — E782 Mixed hyperlipidemia: Secondary | ICD-10-CM | POA: Diagnosis not present

## 2019-09-11 DIAGNOSIS — C61 Malignant neoplasm of prostate: Secondary | ICD-10-CM | POA: Diagnosis not present

## 2019-10-19 DIAGNOSIS — I10 Essential (primary) hypertension: Secondary | ICD-10-CM | POA: Diagnosis not present

## 2019-10-19 DIAGNOSIS — E782 Mixed hyperlipidemia: Secondary | ICD-10-CM | POA: Diagnosis not present

## 2019-10-19 DIAGNOSIS — Z8546 Personal history of malignant neoplasm of prostate: Secondary | ICD-10-CM | POA: Diagnosis not present

## 2019-10-19 DIAGNOSIS — C61 Malignant neoplasm of prostate: Secondary | ICD-10-CM | POA: Diagnosis not present

## 2019-10-19 DIAGNOSIS — N400 Enlarged prostate without lower urinary tract symptoms: Secondary | ICD-10-CM | POA: Diagnosis not present

## 2019-10-19 DIAGNOSIS — M1990 Unspecified osteoarthritis, unspecified site: Secondary | ICD-10-CM | POA: Diagnosis not present

## 2019-10-19 DIAGNOSIS — M169 Osteoarthritis of hip, unspecified: Secondary | ICD-10-CM | POA: Diagnosis not present

## 2019-10-19 DIAGNOSIS — J4521 Mild intermittent asthma with (acute) exacerbation: Secondary | ICD-10-CM | POA: Diagnosis not present

## 2019-10-28 DIAGNOSIS — R21 Rash and other nonspecific skin eruption: Secondary | ICD-10-CM | POA: Diagnosis not present

## 2019-11-12 DIAGNOSIS — M169 Osteoarthritis of hip, unspecified: Secondary | ICD-10-CM | POA: Diagnosis not present

## 2019-11-12 DIAGNOSIS — I10 Essential (primary) hypertension: Secondary | ICD-10-CM | POA: Diagnosis not present

## 2019-11-12 DIAGNOSIS — E782 Mixed hyperlipidemia: Secondary | ICD-10-CM | POA: Diagnosis not present

## 2019-11-12 DIAGNOSIS — Z8546 Personal history of malignant neoplasm of prostate: Secondary | ICD-10-CM | POA: Diagnosis not present

## 2019-11-12 DIAGNOSIS — M1990 Unspecified osteoarthritis, unspecified site: Secondary | ICD-10-CM | POA: Diagnosis not present

## 2019-11-12 DIAGNOSIS — C61 Malignant neoplasm of prostate: Secondary | ICD-10-CM | POA: Diagnosis not present

## 2019-11-12 DIAGNOSIS — J4521 Mild intermittent asthma with (acute) exacerbation: Secondary | ICD-10-CM | POA: Diagnosis not present

## 2019-11-12 DIAGNOSIS — N400 Enlarged prostate without lower urinary tract symptoms: Secondary | ICD-10-CM | POA: Diagnosis not present

## 2019-11-25 DIAGNOSIS — L309 Dermatitis, unspecified: Secondary | ICD-10-CM | POA: Diagnosis not present

## 2019-12-07 DIAGNOSIS — H04123 Dry eye syndrome of bilateral lacrimal glands: Secondary | ICD-10-CM | POA: Diagnosis not present

## 2019-12-07 DIAGNOSIS — H40013 Open angle with borderline findings, low risk, bilateral: Secondary | ICD-10-CM | POA: Diagnosis not present

## 2019-12-07 DIAGNOSIS — H1789 Other corneal scars and opacities: Secondary | ICD-10-CM | POA: Diagnosis not present

## 2019-12-07 DIAGNOSIS — H2513 Age-related nuclear cataract, bilateral: Secondary | ICD-10-CM | POA: Diagnosis not present

## 2019-12-08 DIAGNOSIS — E782 Mixed hyperlipidemia: Secondary | ICD-10-CM | POA: Diagnosis not present

## 2019-12-08 DIAGNOSIS — Z0000 Encounter for general adult medical examination without abnormal findings: Secondary | ICD-10-CM | POA: Diagnosis not present

## 2019-12-08 DIAGNOSIS — Z8546 Personal history of malignant neoplasm of prostate: Secondary | ICD-10-CM | POA: Diagnosis not present

## 2019-12-08 DIAGNOSIS — Z1389 Encounter for screening for other disorder: Secondary | ICD-10-CM | POA: Diagnosis not present

## 2019-12-08 DIAGNOSIS — L309 Dermatitis, unspecified: Secondary | ICD-10-CM | POA: Diagnosis not present

## 2019-12-08 DIAGNOSIS — I10 Essential (primary) hypertension: Secondary | ICD-10-CM | POA: Diagnosis not present

## 2019-12-08 DIAGNOSIS — M519 Unspecified thoracic, thoracolumbar and lumbosacral intervertebral disc disorder: Secondary | ICD-10-CM | POA: Diagnosis not present

## 2019-12-08 DIAGNOSIS — R7309 Other abnormal glucose: Secondary | ICD-10-CM | POA: Diagnosis not present

## 2019-12-10 DIAGNOSIS — M169 Osteoarthritis of hip, unspecified: Secondary | ICD-10-CM | POA: Diagnosis not present

## 2019-12-10 DIAGNOSIS — E782 Mixed hyperlipidemia: Secondary | ICD-10-CM | POA: Diagnosis not present

## 2019-12-10 DIAGNOSIS — J4521 Mild intermittent asthma with (acute) exacerbation: Secondary | ICD-10-CM | POA: Diagnosis not present

## 2019-12-10 DIAGNOSIS — M1990 Unspecified osteoarthritis, unspecified site: Secondary | ICD-10-CM | POA: Diagnosis not present

## 2019-12-10 DIAGNOSIS — C61 Malignant neoplasm of prostate: Secondary | ICD-10-CM | POA: Diagnosis not present

## 2019-12-10 DIAGNOSIS — N400 Enlarged prostate without lower urinary tract symptoms: Secondary | ICD-10-CM | POA: Diagnosis not present

## 2019-12-10 DIAGNOSIS — I10 Essential (primary) hypertension: Secondary | ICD-10-CM | POA: Diagnosis not present

## 2019-12-10 DIAGNOSIS — Z8546 Personal history of malignant neoplasm of prostate: Secondary | ICD-10-CM | POA: Diagnosis not present

## 2019-12-24 ENCOUNTER — Encounter: Payer: Self-pay | Admitting: Orthopaedic Surgery

## 2019-12-24 ENCOUNTER — Ambulatory Visit: Payer: Medicare HMO | Admitting: Orthopaedic Surgery

## 2019-12-24 DIAGNOSIS — M25562 Pain in left knee: Secondary | ICD-10-CM

## 2019-12-24 DIAGNOSIS — G8929 Other chronic pain: Secondary | ICD-10-CM

## 2019-12-24 MED ORDER — METHYLPREDNISOLONE ACETATE 40 MG/ML IJ SUSP
40.0000 mg | INTRAMUSCULAR | Status: AC | PRN
  Administered 2019-12-24: 40 mg via INTRA_ARTICULAR

## 2019-12-24 MED ORDER — LIDOCAINE HCL 1 % IJ SOLN
3.0000 mL | INTRAMUSCULAR | Status: AC | PRN
  Administered 2019-12-24: 3 mL

## 2019-12-24 NOTE — Progress Notes (Signed)
Office Visit Note   Patient: Rodney MCCAULEY Sr.           Date of Birth: [birth year]           MRN: [id number] Visit Date: 12/24/2019              Requested by: [name], MD [address] [place],  [place] [address] PCP: [name], MD   Assessment & Plan: Visit Diagnoses:  1. Chronic pain of left knee     Plan: I was only able to aspirate about 10 cc of clear fluid from the knee and place a steroid injection in it.  Since it is lasted him already over 6 months from the last time, I would only recommend repeating this if needed.  However, if the swelling does occur more quickly and then in shorter intervals, this would warrant an MRI of the left knee.  All questions and concerns were answered and addressed.  Follow-Up Instructions: Return if symptoms worsen or fail to improve.   Orders:  No orders of the defined types were placed in this encounter.  No orders of the defined types were placed in this encounter.     Procedures: Large Joint Inj: L knee on 66/05/2019 3:58 PM Indications: diagnostic evaluation and pain Details: 22 G 1.5 in needle, superolateral approach  Arthrogram: No  Medications: 3 mL lidocaine 1 %; 40 mg methylPREDNISolone acetate 40 MG/ML Outcome: tolerated well, no immediate complications Procedure, treatment alternatives, risks and benefits explained, specific risks discussed. Consent was given by the patient. Immediately prior to procedure a time out was called to verify the correct patient, procedure, equipment, support staff and site/side marked as required. Patient was prepped and draped in the usual sterile fashion.       Clinical Data: No additional findings.   Subjective: Chief Complaint  Patient presents with  . Left Knee - Pain  The patient is well-known to me.  He is a 64 year old gentleman that I saw back in February which was just over 6 months ago.  We aspirated fluid from his knee and place a steroid injection  in his knee.  His x-rays do not show any significant arthritis in the knee.  He does use a cane and he started to get pain in his knee more recently with swelling.  He massage the knee and did place a topical ointment.  He said that made the swelling go down and this is only been for about 2 weeks now.  It has been rating up as needed as well.  He denies any recent injuries.  He denies any acute changes in medical status.  He is still ambulating using a cane.  HPI  Review of Systems He currently denies any headache, chest pain, shortness of breath, fever, chills, nausea, vomiting  Objective: Vital Signs: There were no vitals taken for this visit.  Physical Exam He is alert and orient x3 and in no acute distress Ortho Exam Examination of his knee today on the left side shows only mild effusion.  His range of motion is full.  There are some global tenderness but it is mild.  There is no redness. Specialty Comments:  No specialty comments available.  Imaging: No results found.   PMFS History: Patient Active Problem List   Diagnosis Date Noted  . Status post total replacement of right hip 11/01/2017  . Primary osteoarthritis of right hip 09/09/2017  . Essential hypertension 11/12/2016  . Pure hypercholesterolemia  11/12/2016  . Class 2 severe obesity due to excess calories with serious comorbidity and body mass index (BMI) of 37.0 to 37.9 in adult ([place]) 11/12/2016  . Malignant neoplasm of prostate ([place]) 01/13/2014   Past Medical History:  Diagnosis Date  . Abnormal prostate biopsy 2012  . Arthritis    arthritis back-fingers, disc disease degenerative  with sciatic  pain both legs right > left.  . Bilateral hydrocele   . Bilateral renal cysts   . Cancer [facility])    prostate cancer, bx. 7'15, 2012 bx- was obseved until PSA showed steady increase.  Marland Kitchen Epididymal cyst    Bilateral head  . Heart murmur    pt denies  . High cholesterol   . Hypertension   . Sciatica   . Testicular cyst     Right  . Umbilical hernia   . Wheezing    Occ.    History reviewed. No pertinent family history.  Past Surgical History:  Procedure Laterality Date  . CIRCUMCISION  1980  . COLONOSCOPY  2019  . INNER EAR SURGERY Right   . LYMPHADENECTOMY Bilateral 01/13/2014   Procedure: LYMPHADENECTOMY;  Surgeon: Bernestine Amass, MD;  Location: [facility];  Service: Urology;  Laterality: Bilateral;  . PROSTATE BIOPSY    . ROBOT ASSISTED LAPAROSCOPIC RADICAL PROSTATECTOMY N/A 01/13/2014   Procedure: ROBOTIC ASSISTED LAPAROSCOPIC RADICAL PROSTATECTOMY;  Surgeon: Bernestine Amass, MD;  Location: [facility];  Service: Urology;  Laterality: N/A;  . TONSILLECTOMY    . TOTAL HIP ARTHROPLASTY Right 11/01/2017   Procedure: RIGHT TOTAL HIP ARTHROPLASTY ANTERIOR APPROACH;  Surgeon: Mcarthur Rossetti, MD;  Location: [facility];  Service: Orthopedics;  Laterality: Right;   Social History   Occupational History  . Not on file  Tobacco Use  . Smoking status: Former Smoker    Quit date: 01/11/1974    Years since quitting: 45.9  . Smokeless tobacco: Never Used  Vaping Use  . Vaping Use: Never used  Substance and Sexual Activity  . Alcohol use: Not Currently  . Drug use: No  . Sexual activity: Yes

## 2019-12-25 DIAGNOSIS — U071 COVID-19: Secondary | ICD-10-CM | POA: Diagnosis not present

## 2020-01-11 DIAGNOSIS — N5231 Erectile dysfunction following radical prostatectomy: Secondary | ICD-10-CM | POA: Diagnosis not present

## 2020-01-11 DIAGNOSIS — Z20828 Contact with and (suspected) exposure to other viral communicable diseases: Secondary | ICD-10-CM | POA: Diagnosis not present

## 2020-01-13 DIAGNOSIS — C61 Malignant neoplasm of prostate: Secondary | ICD-10-CM | POA: Diagnosis not present

## 2020-01-13 DIAGNOSIS — I10 Essential (primary) hypertension: Secondary | ICD-10-CM | POA: Diagnosis not present

## 2020-01-13 DIAGNOSIS — M169 Osteoarthritis of hip, unspecified: Secondary | ICD-10-CM | POA: Diagnosis not present

## 2020-01-13 DIAGNOSIS — J4521 Mild intermittent asthma with (acute) exacerbation: Secondary | ICD-10-CM | POA: Diagnosis not present

## 2020-01-13 DIAGNOSIS — M1990 Unspecified osteoarthritis, unspecified site: Secondary | ICD-10-CM | POA: Diagnosis not present

## 2020-01-13 DIAGNOSIS — N400 Enlarged prostate without lower urinary tract symptoms: Secondary | ICD-10-CM | POA: Diagnosis not present

## 2020-01-13 DIAGNOSIS — Z8546 Personal history of malignant neoplasm of prostate: Secondary | ICD-10-CM | POA: Diagnosis not present

## 2020-01-13 DIAGNOSIS — E782 Mixed hyperlipidemia: Secondary | ICD-10-CM | POA: Diagnosis not present

## 2020-02-11 DIAGNOSIS — N400 Enlarged prostate without lower urinary tract symptoms: Secondary | ICD-10-CM | POA: Diagnosis not present

## 2020-02-11 DIAGNOSIS — Z8546 Personal history of malignant neoplasm of prostate: Secondary | ICD-10-CM | POA: Diagnosis not present

## 2020-02-11 DIAGNOSIS — M1990 Unspecified osteoarthritis, unspecified site: Secondary | ICD-10-CM | POA: Diagnosis not present

## 2020-02-11 DIAGNOSIS — J4521 Mild intermittent asthma with (acute) exacerbation: Secondary | ICD-10-CM | POA: Diagnosis not present

## 2020-02-11 DIAGNOSIS — M169 Osteoarthritis of hip, unspecified: Secondary | ICD-10-CM | POA: Diagnosis not present

## 2020-02-11 DIAGNOSIS — E782 Mixed hyperlipidemia: Secondary | ICD-10-CM | POA: Diagnosis not present

## 2020-02-11 DIAGNOSIS — C61 Malignant neoplasm of prostate: Secondary | ICD-10-CM | POA: Diagnosis not present

## 2020-02-11 DIAGNOSIS — I10 Essential (primary) hypertension: Secondary | ICD-10-CM | POA: Diagnosis not present

## 2020-03-01 DIAGNOSIS — R69 Illness, unspecified: Secondary | ICD-10-CM | POA: Diagnosis not present

## 2020-03-08 DIAGNOSIS — E782 Mixed hyperlipidemia: Secondary | ICD-10-CM | POA: Diagnosis not present

## 2020-03-08 DIAGNOSIS — M169 Osteoarthritis of hip, unspecified: Secondary | ICD-10-CM | POA: Diagnosis not present

## 2020-03-08 DIAGNOSIS — J4521 Mild intermittent asthma with (acute) exacerbation: Secondary | ICD-10-CM | POA: Diagnosis not present

## 2020-03-08 DIAGNOSIS — C61 Malignant neoplasm of prostate: Secondary | ICD-10-CM | POA: Diagnosis not present

## 2020-03-08 DIAGNOSIS — M1990 Unspecified osteoarthritis, unspecified site: Secondary | ICD-10-CM | POA: Diagnosis not present

## 2020-03-08 DIAGNOSIS — I10 Essential (primary) hypertension: Secondary | ICD-10-CM | POA: Diagnosis not present

## 2020-03-08 DIAGNOSIS — N400 Enlarged prostate without lower urinary tract symptoms: Secondary | ICD-10-CM | POA: Diagnosis not present

## 2020-03-08 DIAGNOSIS — Z8546 Personal history of malignant neoplasm of prostate: Secondary | ICD-10-CM | POA: Diagnosis not present

## 2020-03-30 ENCOUNTER — Ambulatory Visit
Admission: RE | Admit: 2020-03-30 | Discharge: 2020-03-30 | Disposition: A | Discharge status: Home / Self Care | Payer: Medicare HMO | Source: Ambulatory Visit | Attending: Internal Medicine | Admitting: Internal Medicine

## 2020-03-30 ENCOUNTER — Other Ambulatory Visit: Payer: Self-pay | Admitting: Internal Medicine

## 2020-03-30 DIAGNOSIS — M25551 Pain in right hip: Secondary | ICD-10-CM | POA: Diagnosis not present

## 2020-03-30 DIAGNOSIS — R52 Pain, unspecified: Secondary | ICD-10-CM

## 2020-04-13 DIAGNOSIS — E782 Mixed hyperlipidemia: Secondary | ICD-10-CM | POA: Diagnosis not present

## 2020-04-13 DIAGNOSIS — Z8546 Personal history of malignant neoplasm of prostate: Secondary | ICD-10-CM | POA: Diagnosis not present

## 2020-04-13 DIAGNOSIS — C61 Malignant neoplasm of prostate: Secondary | ICD-10-CM | POA: Diagnosis not present

## 2020-04-13 DIAGNOSIS — I10 Essential (primary) hypertension: Secondary | ICD-10-CM | POA: Diagnosis not present

## 2020-04-13 DIAGNOSIS — N400 Enlarged prostate without lower urinary tract symptoms: Secondary | ICD-10-CM | POA: Diagnosis not present

## 2020-04-13 DIAGNOSIS — J4521 Mild intermittent asthma with (acute) exacerbation: Secondary | ICD-10-CM | POA: Diagnosis not present

## 2020-04-13 DIAGNOSIS — M169 Osteoarthritis of hip, unspecified: Secondary | ICD-10-CM | POA: Diagnosis not present

## 2020-04-13 DIAGNOSIS — M1990 Unspecified osteoarthritis, unspecified site: Secondary | ICD-10-CM | POA: Diagnosis not present

## 2020-04-25 DIAGNOSIS — C61 Malignant neoplasm of prostate: Secondary | ICD-10-CM | POA: Diagnosis not present

## 2020-04-25 DIAGNOSIS — M169 Osteoarthritis of hip, unspecified: Secondary | ICD-10-CM | POA: Diagnosis not present

## 2020-04-25 DIAGNOSIS — M1990 Unspecified osteoarthritis, unspecified site: Secondary | ICD-10-CM | POA: Diagnosis not present

## 2020-04-25 DIAGNOSIS — J4521 Mild intermittent asthma with (acute) exacerbation: Secondary | ICD-10-CM | POA: Diagnosis not present

## 2020-04-25 DIAGNOSIS — E782 Mixed hyperlipidemia: Secondary | ICD-10-CM | POA: Diagnosis not present

## 2020-04-25 DIAGNOSIS — N400 Enlarged prostate without lower urinary tract symptoms: Secondary | ICD-10-CM | POA: Diagnosis not present

## 2020-04-25 DIAGNOSIS — I10 Essential (primary) hypertension: Secondary | ICD-10-CM | POA: Diagnosis not present

## 2020-04-25 DIAGNOSIS — Z8546 Personal history of malignant neoplasm of prostate: Secondary | ICD-10-CM | POA: Diagnosis not present

## 2020-05-03 ENCOUNTER — Other Ambulatory Visit: Payer: Self-pay

## 2020-05-03 ENCOUNTER — Other Ambulatory Visit: Payer: Medicare HMO

## 2020-05-03 DIAGNOSIS — Z20822 Contact with and (suspected) exposure to covid-19: Secondary | ICD-10-CM

## 2020-05-04 LAB — NOVEL CORONAVIRUS, NAA: SARS-CoV-2, NAA: NOT DETECTED

## 2020-05-04 LAB — SARS-COV-2, NAA 2 DAY TAT

## 2020-05-26 DIAGNOSIS — Z8546 Personal history of malignant neoplasm of prostate: Secondary | ICD-10-CM | POA: Diagnosis not present

## 2020-05-26 DIAGNOSIS — C61 Malignant neoplasm of prostate: Secondary | ICD-10-CM | POA: Diagnosis not present

## 2020-05-26 DIAGNOSIS — M169 Osteoarthritis of hip, unspecified: Secondary | ICD-10-CM | POA: Diagnosis not present

## 2020-05-26 DIAGNOSIS — J4521 Mild intermittent asthma with (acute) exacerbation: Secondary | ICD-10-CM | POA: Diagnosis not present

## 2020-05-26 DIAGNOSIS — I10 Essential (primary) hypertension: Secondary | ICD-10-CM | POA: Diagnosis not present

## 2020-05-26 DIAGNOSIS — M1990 Unspecified osteoarthritis, unspecified site: Secondary | ICD-10-CM | POA: Diagnosis not present

## 2020-05-26 DIAGNOSIS — N400 Enlarged prostate without lower urinary tract symptoms: Secondary | ICD-10-CM | POA: Diagnosis not present

## 2020-05-26 DIAGNOSIS — E782 Mixed hyperlipidemia: Secondary | ICD-10-CM | POA: Diagnosis not present

## 2020-07-05 DIAGNOSIS — M1990 Unspecified osteoarthritis, unspecified site: Secondary | ICD-10-CM | POA: Diagnosis not present

## 2020-07-05 DIAGNOSIS — N400 Enlarged prostate without lower urinary tract symptoms: Secondary | ICD-10-CM | POA: Diagnosis not present

## 2020-07-05 DIAGNOSIS — C61 Malignant neoplasm of prostate: Secondary | ICD-10-CM | POA: Diagnosis not present

## 2020-07-05 DIAGNOSIS — I10 Essential (primary) hypertension: Secondary | ICD-10-CM | POA: Diagnosis not present

## 2020-07-05 DIAGNOSIS — E782 Mixed hyperlipidemia: Secondary | ICD-10-CM | POA: Diagnosis not present

## 2020-07-05 DIAGNOSIS — Z8546 Personal history of malignant neoplasm of prostate: Secondary | ICD-10-CM | POA: Diagnosis not present

## 2020-07-05 DIAGNOSIS — M169 Osteoarthritis of hip, unspecified: Secondary | ICD-10-CM | POA: Diagnosis not present

## 2020-07-05 DIAGNOSIS — J4521 Mild intermittent asthma with (acute) exacerbation: Secondary | ICD-10-CM | POA: Diagnosis not present

## 2020-07-13 DIAGNOSIS — N400 Enlarged prostate without lower urinary tract symptoms: Secondary | ICD-10-CM | POA: Diagnosis not present

## 2020-07-13 DIAGNOSIS — I10 Essential (primary) hypertension: Secondary | ICD-10-CM | POA: Diagnosis not present

## 2020-07-13 DIAGNOSIS — M1990 Unspecified osteoarthritis, unspecified site: Secondary | ICD-10-CM | POA: Diagnosis not present

## 2020-07-13 DIAGNOSIS — Z8546 Personal history of malignant neoplasm of prostate: Secondary | ICD-10-CM | POA: Diagnosis not present

## 2020-07-13 DIAGNOSIS — E782 Mixed hyperlipidemia: Secondary | ICD-10-CM | POA: Diagnosis not present

## 2020-07-13 DIAGNOSIS — R7303 Prediabetes: Secondary | ICD-10-CM | POA: Diagnosis not present

## 2020-07-13 DIAGNOSIS — G479 Sleep disorder, unspecified: Secondary | ICD-10-CM | POA: Diagnosis not present

## 2020-07-25 DIAGNOSIS — J4521 Mild intermittent asthma with (acute) exacerbation: Secondary | ICD-10-CM | POA: Diagnosis not present

## 2020-07-25 DIAGNOSIS — I10 Essential (primary) hypertension: Secondary | ICD-10-CM | POA: Diagnosis not present

## 2020-07-25 DIAGNOSIS — E782 Mixed hyperlipidemia: Secondary | ICD-10-CM | POA: Diagnosis not present

## 2020-07-25 DIAGNOSIS — N400 Enlarged prostate without lower urinary tract symptoms: Secondary | ICD-10-CM | POA: Diagnosis not present

## 2020-07-25 DIAGNOSIS — C61 Malignant neoplasm of prostate: Secondary | ICD-10-CM | POA: Diagnosis not present

## 2020-07-25 DIAGNOSIS — M169 Osteoarthritis of hip, unspecified: Secondary | ICD-10-CM | POA: Diagnosis not present

## 2020-07-25 DIAGNOSIS — M1990 Unspecified osteoarthritis, unspecified site: Secondary | ICD-10-CM | POA: Diagnosis not present

## 2020-07-25 DIAGNOSIS — Z8546 Personal history of malignant neoplasm of prostate: Secondary | ICD-10-CM | POA: Diagnosis not present

## 2020-08-01 ENCOUNTER — Ambulatory Visit: Payer: Medicare HMO | Admitting: Physician Assistant

## 2020-08-01 ENCOUNTER — Encounter: Payer: Self-pay | Admitting: Physician Assistant

## 2020-08-01 VITALS — Ht 66.0 in | Wt 208.8 lb

## 2020-08-01 DIAGNOSIS — M7061 Trochanteric bursitis, right hip: Secondary | ICD-10-CM | POA: Diagnosis not present

## 2020-08-01 MED ORDER — METHYLPREDNISOLONE ACETATE 40 MG/ML IJ SUSP
40.0000 mg | INTRAMUSCULAR | Status: AC | PRN
  Administered 2020-08-01: 40 mg via INTRA_ARTICULAR

## 2020-08-01 MED ORDER — LIDOCAINE HCL 1 % IJ SOLN
3.0000 mL | INTRAMUSCULAR | Status: AC | PRN
  Administered 2020-08-01: 3 mL

## 2020-08-01 NOTE — Progress Notes (Signed)
   Procedure Note  Patient: MELTON WALLS Sr.             Date of Birth: 29-Aug-1955           MRN: [id number]             Visit Date: 08/01/2020 HPI: Mr. Fridman is a 65 year old male status post right total hip arthroplasty by Dr. Ninfa Linden 2019.  He is done well until recently.  Over the last couple months he is developed some pain in the lateral aspect of his right hip.  No new injury.  He is also having some groin pain.  States the hip feels a catch.  A has tried a heating pad and meloxicam with minimal relief.  Otherwise no allergy.  He is nondiabetic.  Denies any fevers chills or recent vaccines.  He has continued to do hip exercises as shown by PT and is doing abduction exercises. Radiographs right hip 2 views dated 03/30/2020 are reviewed and shows the right total hip arthroplasty components to be well-seated no acute fractures or bony abnormalities.  Review of systems: See HPI otherwise negative  Physical exam: Bilateral hips fluid range of motion both hips.  He has limited internal and external rotation of the right hip.  Tenderness over the right hip trochanteric region only.  No tenderness of the left hip.  Right hip surgical incisions well-healed.  Procedures: Visit Diagnoses:  1. Trochanteric bursitis, right hip     Large Joint Inj: R greater trochanter on 08/01/2020 10:28 AM Indications: pain Details: 22 G 1.5 in needle, lateral approach  Arthrogram: No  Medications: 3 mL lidocaine 1 %; 40 mg methylPREDNISolone acetate 40 MG/ML Outcome: tolerated well, no immediate complications Procedure, treatment alternatives, risks and benefits explained, specific risks discussed. Consent was given by the patient. Immediately prior to procedure a time out was called to verify the correct patient, procedure, equipment, support staff and site/side marked as required. Patient was prepped and draped in the usual sterile fashion.    Plan: He shown IT band stretching exercises he will  discontinue the abduction exercises.  We will see him back in 2 weeks see what type of response he had to the injection may consider further imaging of the right hip if pain continues or becomes worse.

## 2020-08-15 ENCOUNTER — Ambulatory Visit: Payer: Medicare HMO | Admitting: Physician Assistant

## 2020-09-15 DIAGNOSIS — E782 Mixed hyperlipidemia: Secondary | ICD-10-CM | POA: Diagnosis not present

## 2020-09-15 DIAGNOSIS — J4521 Mild intermittent asthma with (acute) exacerbation: Secondary | ICD-10-CM | POA: Diagnosis not present

## 2020-09-15 DIAGNOSIS — I10 Essential (primary) hypertension: Secondary | ICD-10-CM | POA: Diagnosis not present

## 2020-09-15 DIAGNOSIS — C61 Malignant neoplasm of prostate: Secondary | ICD-10-CM | POA: Diagnosis not present

## 2020-09-15 DIAGNOSIS — M169 Osteoarthritis of hip, unspecified: Secondary | ICD-10-CM | POA: Diagnosis not present

## 2020-09-15 DIAGNOSIS — Z8546 Personal history of malignant neoplasm of prostate: Secondary | ICD-10-CM | POA: Diagnosis not present

## 2020-09-15 DIAGNOSIS — N400 Enlarged prostate without lower urinary tract symptoms: Secondary | ICD-10-CM | POA: Diagnosis not present

## 2020-09-15 DIAGNOSIS — M1990 Unspecified osteoarthritis, unspecified site: Secondary | ICD-10-CM | POA: Diagnosis not present

## 2020-10-10 DIAGNOSIS — N400 Enlarged prostate without lower urinary tract symptoms: Secondary | ICD-10-CM | POA: Diagnosis not present

## 2020-10-10 DIAGNOSIS — E782 Mixed hyperlipidemia: Secondary | ICD-10-CM | POA: Diagnosis not present

## 2020-10-10 DIAGNOSIS — Z8546 Personal history of malignant neoplasm of prostate: Secondary | ICD-10-CM | POA: Diagnosis not present

## 2020-10-10 DIAGNOSIS — C61 Malignant neoplasm of prostate: Secondary | ICD-10-CM | POA: Diagnosis not present

## 2020-10-10 DIAGNOSIS — J4521 Mild intermittent asthma with (acute) exacerbation: Secondary | ICD-10-CM | POA: Diagnosis not present

## 2020-10-10 DIAGNOSIS — M169 Osteoarthritis of hip, unspecified: Secondary | ICD-10-CM | POA: Diagnosis not present

## 2020-10-10 DIAGNOSIS — M1990 Unspecified osteoarthritis, unspecified site: Secondary | ICD-10-CM | POA: Diagnosis not present

## 2020-10-10 DIAGNOSIS — I10 Essential (primary) hypertension: Secondary | ICD-10-CM | POA: Diagnosis not present

## 2020-11-14 DIAGNOSIS — I10 Essential (primary) hypertension: Secondary | ICD-10-CM | POA: Diagnosis not present

## 2020-11-14 DIAGNOSIS — M169 Osteoarthritis of hip, unspecified: Secondary | ICD-10-CM | POA: Diagnosis not present

## 2020-11-14 DIAGNOSIS — M1990 Unspecified osteoarthritis, unspecified site: Secondary | ICD-10-CM | POA: Diagnosis not present

## 2020-11-14 DIAGNOSIS — C61 Malignant neoplasm of prostate: Secondary | ICD-10-CM | POA: Diagnosis not present

## 2020-11-14 DIAGNOSIS — E782 Mixed hyperlipidemia: Secondary | ICD-10-CM | POA: Diagnosis not present

## 2020-11-14 DIAGNOSIS — J4521 Mild intermittent asthma with (acute) exacerbation: Secondary | ICD-10-CM | POA: Diagnosis not present

## 2020-11-14 DIAGNOSIS — Z8546 Personal history of malignant neoplasm of prostate: Secondary | ICD-10-CM | POA: Diagnosis not present

## 2020-11-14 DIAGNOSIS — N400 Enlarged prostate without lower urinary tract symptoms: Secondary | ICD-10-CM | POA: Diagnosis not present

## 2020-12-09 DIAGNOSIS — I10 Essential (primary) hypertension: Secondary | ICD-10-CM | POA: Diagnosis not present

## 2020-12-09 DIAGNOSIS — E782 Mixed hyperlipidemia: Secondary | ICD-10-CM | POA: Diagnosis not present

## 2020-12-09 DIAGNOSIS — Z1389 Encounter for screening for other disorder: Secondary | ICD-10-CM | POA: Diagnosis not present

## 2020-12-09 DIAGNOSIS — R21 Rash and other nonspecific skin eruption: Secondary | ICD-10-CM | POA: Diagnosis not present

## 2020-12-09 DIAGNOSIS — N400 Enlarged prostate without lower urinary tract symptoms: Secondary | ICD-10-CM | POA: Diagnosis not present

## 2020-12-09 DIAGNOSIS — Z0000 Encounter for general adult medical examination without abnormal findings: Secondary | ICD-10-CM | POA: Diagnosis not present

## 2020-12-09 DIAGNOSIS — Z8546 Personal history of malignant neoplasm of prostate: Secondary | ICD-10-CM | POA: Diagnosis not present

## 2020-12-09 DIAGNOSIS — R7303 Prediabetes: Secondary | ICD-10-CM | POA: Diagnosis not present

## 2020-12-15 DIAGNOSIS — Z8546 Personal history of malignant neoplasm of prostate: Secondary | ICD-10-CM | POA: Diagnosis not present

## 2020-12-15 DIAGNOSIS — E782 Mixed hyperlipidemia: Secondary | ICD-10-CM | POA: Diagnosis not present

## 2020-12-15 DIAGNOSIS — M169 Osteoarthritis of hip, unspecified: Secondary | ICD-10-CM | POA: Diagnosis not present

## 2020-12-15 DIAGNOSIS — C61 Malignant neoplasm of prostate: Secondary | ICD-10-CM | POA: Diagnosis not present

## 2020-12-15 DIAGNOSIS — M1990 Unspecified osteoarthritis, unspecified site: Secondary | ICD-10-CM | POA: Diagnosis not present

## 2020-12-15 DIAGNOSIS — I10 Essential (primary) hypertension: Secondary | ICD-10-CM | POA: Diagnosis not present

## 2020-12-15 DIAGNOSIS — J4521 Mild intermittent asthma with (acute) exacerbation: Secondary | ICD-10-CM | POA: Diagnosis not present

## 2020-12-15 DIAGNOSIS — N400 Enlarged prostate without lower urinary tract symptoms: Secondary | ICD-10-CM | POA: Diagnosis not present

## 2020-12-31 DIAGNOSIS — M25511 Pain in right shoulder: Secondary | ICD-10-CM | POA: Diagnosis not present

## 2021-01-12 DIAGNOSIS — I10 Essential (primary) hypertension: Secondary | ICD-10-CM | POA: Diagnosis not present

## 2021-01-15 DIAGNOSIS — E782 Mixed hyperlipidemia: Secondary | ICD-10-CM | POA: Diagnosis not present

## 2021-01-15 DIAGNOSIS — M1990 Unspecified osteoarthritis, unspecified site: Secondary | ICD-10-CM | POA: Diagnosis not present

## 2021-01-15 DIAGNOSIS — J4521 Mild intermittent asthma with (acute) exacerbation: Secondary | ICD-10-CM | POA: Diagnosis not present

## 2021-01-15 DIAGNOSIS — I10 Essential (primary) hypertension: Secondary | ICD-10-CM | POA: Diagnosis not present

## 2021-01-15 DIAGNOSIS — N400 Enlarged prostate without lower urinary tract symptoms: Secondary | ICD-10-CM | POA: Diagnosis not present

## 2021-02-10 DIAGNOSIS — I10 Essential (primary) hypertension: Secondary | ICD-10-CM | POA: Diagnosis not present

## 2021-02-10 DIAGNOSIS — E782 Mixed hyperlipidemia: Secondary | ICD-10-CM | POA: Diagnosis not present

## 2021-02-10 DIAGNOSIS — M1990 Unspecified osteoarthritis, unspecified site: Secondary | ICD-10-CM | POA: Diagnosis not present

## 2021-02-10 DIAGNOSIS — M169 Osteoarthritis of hip, unspecified: Secondary | ICD-10-CM | POA: Diagnosis not present

## 2021-02-10 DIAGNOSIS — J4521 Mild intermittent asthma with (acute) exacerbation: Secondary | ICD-10-CM | POA: Diagnosis not present

## 2021-02-10 DIAGNOSIS — N400 Enlarged prostate without lower urinary tract symptoms: Secondary | ICD-10-CM | POA: Diagnosis not present

## 2021-03-10 DIAGNOSIS — N400 Enlarged prostate without lower urinary tract symptoms: Secondary | ICD-10-CM | POA: Diagnosis not present

## 2021-03-10 DIAGNOSIS — M1990 Unspecified osteoarthritis, unspecified site: Secondary | ICD-10-CM | POA: Diagnosis not present

## 2021-03-10 DIAGNOSIS — M169 Osteoarthritis of hip, unspecified: Secondary | ICD-10-CM | POA: Diagnosis not present

## 2021-03-10 DIAGNOSIS — I10 Essential (primary) hypertension: Secondary | ICD-10-CM | POA: Diagnosis not present

## 2021-03-10 DIAGNOSIS — J4521 Mild intermittent asthma with (acute) exacerbation: Secondary | ICD-10-CM | POA: Diagnosis not present

## 2021-03-10 DIAGNOSIS — E782 Mixed hyperlipidemia: Secondary | ICD-10-CM | POA: Diagnosis not present

## 2021-03-19 DIAGNOSIS — J069 Acute upper respiratory infection, unspecified: Secondary | ICD-10-CM | POA: Diagnosis not present

## 2021-04-11 DIAGNOSIS — I10 Essential (primary) hypertension: Secondary | ICD-10-CM | POA: Diagnosis not present

## 2021-04-11 DIAGNOSIS — E782 Mixed hyperlipidemia: Secondary | ICD-10-CM | POA: Diagnosis not present

## 2021-04-11 DIAGNOSIS — Z8546 Personal history of malignant neoplasm of prostate: Secondary | ICD-10-CM | POA: Diagnosis not present

## 2021-04-11 DIAGNOSIS — M1990 Unspecified osteoarthritis, unspecified site: Secondary | ICD-10-CM | POA: Diagnosis not present

## 2021-04-11 DIAGNOSIS — C61 Malignant neoplasm of prostate: Secondary | ICD-10-CM | POA: Diagnosis not present

## 2021-04-11 DIAGNOSIS — J4521 Mild intermittent asthma with (acute) exacerbation: Secondary | ICD-10-CM | POA: Diagnosis not present

## 2021-04-11 DIAGNOSIS — N400 Enlarged prostate without lower urinary tract symptoms: Secondary | ICD-10-CM | POA: Diagnosis not present

## 2021-04-11 DIAGNOSIS — M169 Osteoarthritis of hip, unspecified: Secondary | ICD-10-CM | POA: Diagnosis not present

## 2021-04-25 DIAGNOSIS — H179 Unspecified corneal scar and opacity: Secondary | ICD-10-CM | POA: Diagnosis not present

## 2021-04-25 DIAGNOSIS — H0520 Unspecified exophthalmos: Secondary | ICD-10-CM | POA: Diagnosis not present

## 2021-04-25 DIAGNOSIS — H2513 Age-related nuclear cataract, bilateral: Secondary | ICD-10-CM | POA: Diagnosis not present

## 2021-04-25 DIAGNOSIS — H04123 Dry eye syndrome of bilateral lacrimal glands: Secondary | ICD-10-CM | POA: Diagnosis not present

## 2021-04-25 DIAGNOSIS — H40013 Open angle with borderline findings, low risk, bilateral: Secondary | ICD-10-CM | POA: Diagnosis not present

## 2021-05-10 DIAGNOSIS — Z23 Encounter for immunization: Secondary | ICD-10-CM | POA: Diagnosis not present

## 2021-05-10 DIAGNOSIS — M519 Unspecified thoracic, thoracolumbar and lumbosacral intervertebral disc disorder: Secondary | ICD-10-CM | POA: Diagnosis not present

## 2021-05-10 DIAGNOSIS — R7303 Prediabetes: Secondary | ICD-10-CM | POA: Diagnosis not present

## 2021-05-10 DIAGNOSIS — I10 Essential (primary) hypertension: Secondary | ICD-10-CM | POA: Diagnosis not present

## 2021-05-10 DIAGNOSIS — M169 Osteoarthritis of hip, unspecified: Secondary | ICD-10-CM | POA: Diagnosis not present

## 2021-05-10 DIAGNOSIS — E782 Mixed hyperlipidemia: Secondary | ICD-10-CM | POA: Diagnosis not present

## 2021-05-10 DIAGNOSIS — Z8546 Personal history of malignant neoplasm of prostate: Secondary | ICD-10-CM | POA: Diagnosis not present

## 2021-05-11 DIAGNOSIS — J4521 Mild intermittent asthma with (acute) exacerbation: Secondary | ICD-10-CM | POA: Diagnosis not present

## 2021-05-11 DIAGNOSIS — E782 Mixed hyperlipidemia: Secondary | ICD-10-CM | POA: Diagnosis not present

## 2021-05-11 DIAGNOSIS — M1990 Unspecified osteoarthritis, unspecified site: Secondary | ICD-10-CM | POA: Diagnosis not present

## 2021-05-11 DIAGNOSIS — I10 Essential (primary) hypertension: Secondary | ICD-10-CM | POA: Diagnosis not present

## 2021-05-11 DIAGNOSIS — Z8546 Personal history of malignant neoplasm of prostate: Secondary | ICD-10-CM | POA: Diagnosis not present

## 2021-05-11 DIAGNOSIS — C61 Malignant neoplasm of prostate: Secondary | ICD-10-CM | POA: Diagnosis not present

## 2021-05-11 DIAGNOSIS — M169 Osteoarthritis of hip, unspecified: Secondary | ICD-10-CM | POA: Diagnosis not present

## 2021-05-11 DIAGNOSIS — N400 Enlarged prostate without lower urinary tract symptoms: Secondary | ICD-10-CM | POA: Diagnosis not present

## 2021-06-13 DIAGNOSIS — I10 Essential (primary) hypertension: Secondary | ICD-10-CM | POA: Diagnosis not present

## 2021-06-13 DIAGNOSIS — E782 Mixed hyperlipidemia: Secondary | ICD-10-CM | POA: Diagnosis not present

## 2021-07-19 ENCOUNTER — Ambulatory Visit
Admission: RE | Admit: 2021-07-19 | Discharge: 2021-07-19 | Disposition: A | Discharge status: Home / Self Care | Payer: Medicare HMO | Source: Ambulatory Visit | Attending: Internal Medicine | Admitting: Internal Medicine

## 2021-07-19 ENCOUNTER — Other Ambulatory Visit: Payer: Self-pay | Admitting: Internal Medicine

## 2021-07-19 DIAGNOSIS — I10 Essential (primary) hypertension: Secondary | ICD-10-CM | POA: Diagnosis not present

## 2021-07-19 DIAGNOSIS — R062 Wheezing: Secondary | ICD-10-CM

## 2021-07-19 DIAGNOSIS — R0601 Orthopnea: Secondary | ICD-10-CM | POA: Diagnosis not present

## 2021-08-07 DIAGNOSIS — Z03818 Encounter for observation for suspected exposure to other biological agents ruled out: Secondary | ICD-10-CM | POA: Diagnosis not present

## 2021-08-07 DIAGNOSIS — R0989 Other specified symptoms and signs involving the circulatory and respiratory systems: Secondary | ICD-10-CM | POA: Diagnosis not present

## 2021-08-24 DIAGNOSIS — M1990 Unspecified osteoarthritis, unspecified site: Secondary | ICD-10-CM | POA: Diagnosis not present

## 2021-08-24 DIAGNOSIS — N400 Enlarged prostate without lower urinary tract symptoms: Secondary | ICD-10-CM | POA: Diagnosis not present

## 2021-08-24 DIAGNOSIS — I10 Essential (primary) hypertension: Secondary | ICD-10-CM | POA: Diagnosis not present

## 2021-08-24 DIAGNOSIS — E782 Mixed hyperlipidemia: Secondary | ICD-10-CM | POA: Diagnosis not present

## 2021-12-11 DIAGNOSIS — N182 Chronic kidney disease, stage 2 (mild): Secondary | ICD-10-CM | POA: Diagnosis not present

## 2021-12-11 DIAGNOSIS — M169 Osteoarthritis of hip, unspecified: Secondary | ICD-10-CM | POA: Diagnosis not present

## 2021-12-11 DIAGNOSIS — Z1331 Encounter for screening for depression: Secondary | ICD-10-CM | POA: Diagnosis not present

## 2021-12-11 DIAGNOSIS — I10 Essential (primary) hypertension: Secondary | ICD-10-CM | POA: Diagnosis not present

## 2021-12-11 DIAGNOSIS — N400 Enlarged prostate without lower urinary tract symptoms: Secondary | ICD-10-CM | POA: Diagnosis not present

## 2021-12-11 DIAGNOSIS — R7303 Prediabetes: Secondary | ICD-10-CM | POA: Diagnosis not present

## 2021-12-11 DIAGNOSIS — M519 Unspecified thoracic, thoracolumbar and lumbosacral intervertebral disc disorder: Secondary | ICD-10-CM | POA: Diagnosis not present

## 2021-12-11 DIAGNOSIS — Z0000 Encounter for general adult medical examination without abnormal findings: Secondary | ICD-10-CM | POA: Diagnosis not present

## 2021-12-11 DIAGNOSIS — Z8546 Personal history of malignant neoplasm of prostate: Secondary | ICD-10-CM | POA: Diagnosis not present

## 2021-12-11 DIAGNOSIS — Z23 Encounter for immunization: Secondary | ICD-10-CM | POA: Diagnosis not present

## 2021-12-11 DIAGNOSIS — E782 Mixed hyperlipidemia: Secondary | ICD-10-CM | POA: Diagnosis not present

## 2021-12-11 DIAGNOSIS — J309 Allergic rhinitis, unspecified: Secondary | ICD-10-CM | POA: Diagnosis not present

## 2022-02-26 DIAGNOSIS — Z03818 Encounter for observation for suspected exposure to other biological agents ruled out: Secondary | ICD-10-CM | POA: Diagnosis not present

## 2022-02-26 DIAGNOSIS — R059 Cough, unspecified: Secondary | ICD-10-CM | POA: Diagnosis not present

## 2022-03-21 ENCOUNTER — Other Ambulatory Visit: Payer: Self-pay | Admitting: Internal Medicine

## 2022-03-21 ENCOUNTER — Ambulatory Visit
Admission: RE | Admit: 2022-03-21 | Discharge: 2022-03-21 | Disposition: A | Discharge status: Home / Self Care | Payer: Medicare HMO | Source: Ambulatory Visit | Attending: Internal Medicine | Admitting: Internal Medicine

## 2022-03-21 DIAGNOSIS — R053 Chronic cough: Secondary | ICD-10-CM

## 2022-03-21 DIAGNOSIS — I771 Stricture of artery: Secondary | ICD-10-CM | POA: Diagnosis not present

## 2022-03-21 DIAGNOSIS — R062 Wheezing: Secondary | ICD-10-CM

## 2022-03-21 DIAGNOSIS — I10 Essential (primary) hypertension: Secondary | ICD-10-CM | POA: Diagnosis not present

## 2022-04-26 ENCOUNTER — Ambulatory Visit: Payer: Medicare HMO | Admitting: Pulmonary Disease

## 2022-04-26 ENCOUNTER — Encounter: Payer: Self-pay | Admitting: Pulmonary Disease

## 2022-04-26 VITALS — BP 130/80 | HR 62 | Ht 66.0 in | Wt 210.6 lb

## 2022-04-26 DIAGNOSIS — R0602 Shortness of breath: Secondary | ICD-10-CM | POA: Diagnosis not present

## 2022-04-26 DIAGNOSIS — Z87891 Personal history of nicotine dependence: Secondary | ICD-10-CM | POA: Diagnosis not present

## 2022-04-26 DIAGNOSIS — R062 Wheezing: Secondary | ICD-10-CM | POA: Diagnosis not present

## 2022-04-26 MED ORDER — FLUTICASONE FUROATE-VILANTEROL 100-25 MCG/ACT IN AEPB: 1.0000 | INHALATION_SPRAY | Freq: Every day | RESPIRATORY_TRACT | 3 refills | Status: DC

## 2022-04-26 MED ORDER — VENTOLIN HFA 108 (90 BASE) MCG/ACT IN AERS: 1.0000 | INHALATION_SPRAY | RESPIRATORY_TRACT | 3 refills | Status: AC | PRN

## 2022-04-26 NOTE — Progress Notes (Signed)
Synopsis: Referred in Jan 2024 for wheezing by [name], MD  Subjective:   PATIENT ID: Rodney Maynard. GENDER: male DOB: 06/14/1955, MRN: [id number]  Chief Complaint  Patient presents with   Consult    Wheezing     This is a 67 year old gentleman, past medical history of arthritis, prostate cancer in 2012, heart murmur, hypertension. Patient presents for evaluation as a referral for wheezing.  Was recently seen by primary care provider Dr. Deforest Hoyles with complaints of cough.  Patient has wheezing that is worse at nighttime.  Occasionally has shortness of breath.  Wife is concerned about him seeing him here today for evaluation.  He likes to cook.  On occasion if there is smoke in the kitchen or extra steam he will feel like his has chest tightness or wheezing then.  Sometimes even wears a mask when he cooks.  His children have history of asthma.  He has never had asthma symptoms before.  He used to smoke a max of 3 packs/day but he quit when he was 67 years old.  He is retired now.  He used to work in [employer].  He also worked briefly in the operating room.    Past Medical History:  Diagnosis Date   Abnormal prostate biopsy 2012   Arthritis    arthritis back-fingers, disc disease degenerative  with sciatic  pain both legs right > left.   Bilateral hydrocele    Bilateral renal cysts    Cancer (HCC)    prostate cancer, bx. 7'15, 2012 bx- was obseved until PSA showed steady increase.   Epididymal cyst    Bilateral head   Heart murmur    pt denies   High cholesterol    Hypertension    Sciatica    Testicular cyst    Right   Umbilical hernia    Wheezing    Occ.     No family history on file.   Past Surgical History:  Procedure Laterality Date   CIRCUMCISION  1980   COLONOSCOPY  2019   INNER EAR SURGERY Right    LYMPHADENECTOMY Bilateral 01/13/2014   Procedure: LYMPHADENECTOMY;  Surgeon: Bernestine Amass, MD;  Location: [facility];  Service: Urology;  Laterality:  Bilateral;   PROSTATE BIOPSY     ROBOT ASSISTED LAPAROSCOPIC RADICAL PROSTATECTOMY N/A 01/13/2014   Procedure: ROBOTIC ASSISTED LAPAROSCOPIC RADICAL PROSTATECTOMY;  Surgeon: Bernestine Amass, MD;  Location: [facility];  Service: Urology;  Laterality: N/A;   TONSILLECTOMY     TOTAL HIP ARTHROPLASTY Right 11/01/2017   Procedure: RIGHT TOTAL HIP ARTHROPLASTY ANTERIOR APPROACH;  Surgeon: Mcarthur Rossetti, MD;  Location: [facility];  Service: Orthopedics;  Laterality: Right;    Social History   Socioeconomic History   Marital status: Married    Spouse name: Not on file   Number of children: Not on file   Years of education: Not on file   Highest education level: Not on file  Occupational History   Not on file  Tobacco Use   Smoking status: Former    Types: Cigarettes    Quit date: 01/11/1974    Years since quitting: 48.3   Smokeless tobacco: Never  Vaping Use   Vaping Use: Never used  Substance and Sexual Activity   Alcohol use: Not Currently   Drug use: No   Sexual activity: Yes  Other Topics Concern   Not on file  Social History Narrative   Not on file   Social Determinants of  Health   Financial Resource Strain: Not on file  Food Insecurity: Not on file  Transportation Needs: Not on file  Physical Activity: Not on file  Stress: Not on file  Social Connections: Not on file  Intimate Partner Violence: Not on file     Allergies  Allergen Reactions   Latex Itching     Outpatient Medications Prior to Visit  Medication Sig Dispense Refill   albuterol (VENTOLIN HFA) 108 (90 Base) MCG/ACT inhaler 2 puffs as needed     acetaminophen (ACETAMINOPHEN EXTRA STRENGTH) 500 MG tablet 1 tablet as needed     amLODipine (NORVASC) 10 MG tablet Take 10 mg by mouth every morning.     aspirin (ASPIRIN 81) 81 MG chewable tablet 1 tablet     aspirin 81 MG chewable tablet Chew 1 tablet (81 mg total) by mouth 2 (two) times daily. 60 tablet 0   Cyanocobalamin (VITAMIN B 12) 500 MCG TABS 1  tablet     diphenhydrAMINE HCl (ZZZQUIL) 50 MG/30ML LIQD Take 25 mg by mouth at bedtime as needed (sleep).     gabapentin (NEURONTIN) 300 MG capsule Take 300 mg by mouth 3 (three) times daily.     hydrOXYzine (ATARAX/VISTARIL) 25 MG tablet Take 25 mg by mouth at bedtime as needed.     indomethacin (INDOCIN Maynard) 75 MG CR capsule Take 75 mg by mouth daily with breakfast.     lisinopril-hydrochlorothiazide (PRINZIDE,ZESTORETIC) 20-25 MG per tablet Take 1 tablet by mouth every morning.      meloxicam (MOBIC) 15 MG tablet Take 1 tablet by mouth daily.     methocarbamol (ROBAXIN) 500 MG tablet TAKE ONE TABLET BY MOUTH EVERY 6 HOURS AS NEEDED FOR MUSCLE SPASMS 50 tablet 0   metoprolol (TOPROL-XL) 200 MG 24 hr tablet Take 100 mg by mouth 2 (two) times daily.     metoprolol tartrate (LOPRESSOR) 50 MG tablet Take by mouth.     Multiple Vitamin (MULTIVITAMINS PO) 1 tablet     naproxen sodium (ALEVE) 220 MG tablet Take 220 mg by mouth at bedtime as needed (pain).     Omega-3 Fatty Acids (FISH OIL PO) Take 520 mg by mouth daily.     oxyCODONE (OXY IR/ROXICODONE) 5 MG immediate release tablet Take 1-2 tablets (5-10 mg total) by mouth every 4 (four) hours as needed for moderate pain (pain score 4-6). 50 tablet 0   triamcinolone ointment (KENALOG) 0.1 % 1 application     white petrolatum (VASELINE) GEL Apply 1 application topically as needed (itching).     No facility-administered medications prior to visit.    Review of Systems  Constitutional:  Negative for chills, fever, malaise/fatigue and weight loss.  HENT:  Negative for hearing loss, sore throat and tinnitus.   Eyes:  Negative for blurred vision and double vision.  Respiratory:  Positive for shortness of breath and wheezing. Negative for cough, hemoptysis, sputum production and stridor.   Cardiovascular:  Negative for chest pain, palpitations, orthopnea, leg swelling and PND.  Gastrointestinal:  Negative for abdominal pain, constipation, diarrhea,  heartburn, nausea and vomiting.  Genitourinary:  Negative for dysuria, hematuria and urgency.  Musculoskeletal:  Negative for joint pain and myalgias.  Skin:  Negative for itching and rash.  Neurological:  Negative for dizziness, tingling, weakness and headaches.  Endo/Heme/Allergies:  Negative for environmental allergies. Does not bruise/bleed easily.  Psychiatric/Behavioral:  Negative for depression. The patient is not nervous/anxious and does not have insomnia.   All other systems reviewed and are  negative.    Objective:  Physical Exam Vitals reviewed.  Constitutional:      General: He is not in acute distress.    Appearance: He is well-developed. He is obese.  HENT:     Head: Normocephalic and atraumatic.  Eyes:     General: No scleral icterus.    Conjunctiva/sclera: Conjunctivae normal.     Pupils: Pupils are equal, round, and reactive to light.  Neck:     Vascular: No JVD.     Trachea: No tracheal deviation.  Cardiovascular:     Rate and Rhythm: Normal rate and regular rhythm.     Heart sounds: Normal heart sounds. No murmur heard. Pulmonary:     Effort: Pulmonary effort is normal. No tachypnea, accessory muscle usage or respiratory distress.     Breath sounds: No stridor. No wheezing, rhonchi or rales.  Abdominal:     General: There is no distension.     Palpations: Abdomen is soft.     Tenderness: There is no abdominal tenderness.  Musculoskeletal:        General: No tenderness.     Cervical back: Neck supple.  Lymphadenopathy:     Cervical: No cervical adenopathy.  Skin:    General: Skin is warm and dry.     Capillary Refill: Capillary refill takes less than 2 seconds.     Findings: No rash.  Neurological:     Mental Status: He is alert and oriented to person, place, and time.  Psychiatric:        Behavior: Behavior normal.      Vitals:   04/26/22 1046  BP: 130/80  Pulse: 62  SpO2: 96%  Weight: 210 lb 9.6 oz (95.5 kg)  Height: '5\' 6"'$  (1.676 m)    96% on RA BMI Readings from Last 3 Encounters:  04/26/22 33.99 kg/m  08/01/20 33.70 kg/m  11/01/17 34.06 kg/m   Wt Readings from Last 3 Encounters:  04/26/22 210 lb 9.6 oz (95.5 kg)  08/01/20 208 lb 12.8 oz (94.7 kg)  11/01/17 211 lb (95.7 kg)     CBC    Component Value Date/Time   WBC 12.0 (H) 11/02/2017 0928   RBC 3.88 (L) 11/02/2017 0928   HGB 12.1 (L) 11/02/2017 0928   HCT 35.2 (L) 11/02/2017 0928   PLT 242 11/02/2017 0928   MCV 90.7 11/02/2017 0928   MCH 31.2 11/02/2017 0928   MCHC 34.4 11/02/2017 0928   RDW 12.9 11/02/2017 0928   LYMPHSABS 2.4 02/27/2010 2117   MONOABS 0.6 02/27/2010 2117   EOSABS 0.2 02/27/2010 2117   BASOSABS 0.0 02/27/2010 2117      Chest Imaging:  Chest x-ray 12 03/21/2022: Clear chest x-ray no infiltrate  Pulmonary Functions Testing Results:     No data to display          FeNO:   Pathology:   Echocardiogram:   Heart Catheterization:     Assessment & Plan:     ICD-10-CM   1. Wheezing  R06.2 Pulmonary Function Test    2. Former smoker  Z87.891     3. SOB (shortness of breath)  R06.02       Discussion:  This is a 67 year old gentleman, former smoker quit when he was 67 years old, and has developed chest tightness and wheezing.  Symptoms are worse at nighttime.  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  Plan: Not sure what is driving his symptoms at this time. He  has children with a history of asthma. He has not had any pulmonary function test in the past. I am not convinced that he smoked long enough to have COPD. We could be dealing with a more reactive airway disease. Will start him empirically on Breo 100 He can use this 1 puff daily. Continue albuterol as needed because he does state that his albuterol inhaler does help him when he has the symptoms. RTC in 6 weeks after pulmonary function test are complete.   Current Outpatient Medications:    fluticasone  furoate-vilanterol (BREO ELLIPTA) 100-25 MCG/ACT AEPB, Inhale 1 puff into the lungs daily., Disp: 60 each, Rfl: 3   acetaminophen (ACETAMINOPHEN EXTRA STRENGTH) 500 MG tablet, 1 tablet as needed, Disp: , Rfl:    amLODipine (NORVASC) 10 MG tablet, Take 10 mg by mouth every morning., Disp: , Rfl:    aspirin (ASPIRIN 81) 81 MG chewable tablet, 1 tablet, Disp: , Rfl:    aspirin 81 MG chewable tablet, Chew 1 tablet (81 mg total) by mouth 2 (two) times daily., Disp: 60 tablet, Rfl: 0   Cyanocobalamin (VITAMIN B 12) 500 MCG TABS, 1 tablet, Disp: , Rfl:    diphenhydrAMINE HCl (ZZZQUIL) 50 MG/30ML LIQD, Take 25 mg by mouth at bedtime as needed (sleep)., Disp: , Rfl:    gabapentin (NEURONTIN) 300 MG capsule, Take 300 mg by mouth 3 (three) times daily., Disp: , Rfl:    hydrOXYzine (ATARAX/VISTARIL) 25 MG tablet, Take 25 mg by mouth at bedtime as needed., Disp: , Rfl:    indomethacin (INDOCIN Maynard) 75 MG CR capsule, Take 75 mg by mouth daily with breakfast., Disp: , Rfl:    lisinopril-hydrochlorothiazide (PRINZIDE,ZESTORETIC) 20-25 MG per tablet, Take 1 tablet by mouth every morning. , Disp: , Rfl:    meloxicam (MOBIC) 15 MG tablet, Take 1 tablet by mouth daily., Disp: , Rfl:    methocarbamol (ROBAXIN) 500 MG tablet, TAKE ONE TABLET BY MOUTH EVERY 6 HOURS AS NEEDED FOR MUSCLE SPASMS, Disp: 50 tablet, Rfl: 0   metoprolol (TOPROL-XL) 200 MG 24 hr tablet, Take 100 mg by mouth 2 (two) times daily., Disp: , Rfl:    metoprolol tartrate (LOPRESSOR) 50 MG tablet, Take by mouth., Disp: , Rfl:    Multiple Vitamin (MULTIVITAMINS PO), 1 tablet, Disp: , Rfl:    naproxen sodium (ALEVE) 220 MG tablet, Take 220 mg by mouth at bedtime as needed (pain)., Disp: , Rfl:    Omega-3 Fatty Acids (FISH OIL PO), Take 520 mg by mouth daily., Disp: , Rfl:    oxyCODONE (OXY IR/ROXICODONE) 5 MG immediate release tablet, Take 1-2 tablets (5-10 mg total) by mouth every 4 (four) hours as needed for moderate pain (pain score 4-6)., Disp: 50  tablet, Rfl: 0   triamcinolone ointment (KENALOG) 0.1 %, 1 application, Disp: , Rfl:    VENTOLIN HFA 108 (90 Base) MCG/ACT inhaler, Inhale 1-2 puffs into the lungs every 4 (four) hours as needed for wheezing or shortness of breath., Disp: 18 g, Rfl: 3   white petrolatum (VASELINE) GEL, Apply 1 application topically as needed (itching)., Disp: , Rfl:     Garner Nash, DO [facility] 04/26/2022 11:10 AM

## 2022-04-26 NOTE — Patient Instructions (Addendum)
Thank you for visiting Dr. Valeta Harms at [facility]. Today we recommend the following:  Orders Placed This Encounter  Procedures   Pulmonary Function Test   Meds ordered this encounter  Medications   VENTOLIN HFA 108 (90 Base) MCG/ACT inhaler    Sig: Inhale 1-2 puffs into the lungs every 4 (four) hours as needed for wheezing or shortness of breath.    Dispense:  18 g    Refill:  3   fluticasone furoate-vilanterol (BREO ELLIPTA) 100-25 MCG/ACT AEPB    Sig: Inhale 1 puff into the lungs daily.    Dispense:  60 each    Refill:  3   Return in about 6 weeks (around 06/07/2022) for with APP.    Please do your part to reduce the spread of COVID-19.

## 2022-06-07 ENCOUNTER — Ambulatory Visit (INDEPENDENT_AMBULATORY_CARE_PROVIDER_SITE_OTHER): Payer: Medicare HMO | Admitting: Nurse Practitioner

## 2022-06-07 ENCOUNTER — Ambulatory Visit (INDEPENDENT_AMBULATORY_CARE_PROVIDER_SITE_OTHER): Payer: Medicare HMO | Admitting: Pulmonary Disease

## 2022-06-07 ENCOUNTER — Encounter: Payer: Self-pay | Admitting: Nurse Practitioner

## 2022-06-07 VITALS — BP 140/82 | HR 74 | Ht 66.0 in | Wt 215.2 lb

## 2022-06-07 DIAGNOSIS — R062 Wheezing: Secondary | ICD-10-CM | POA: Diagnosis not present

## 2022-06-07 DIAGNOSIS — J4530 Mild persistent asthma, uncomplicated: Secondary | ICD-10-CM | POA: Diagnosis not present

## 2022-06-07 DIAGNOSIS — E669 Obesity, unspecified: Secondary | ICD-10-CM | POA: Diagnosis not present

## 2022-06-07 LAB — CBC WITH DIFFERENTIAL/PLATELET
Basophils Absolute: 0 10*3/uL (ref 0.0–0.1)
Basophils Relative: 0.7 % (ref 0.0–3.0)
Eosinophils Absolute: 0.4 10*3/uL (ref 0.0–0.7)
Eosinophils Relative: 6.9 % — ABNORMAL HIGH (ref 0.0–5.0)
HCT: 39.7 % (ref 39.0–52.0)
Hemoglobin: 13.3 g/dL (ref 13.0–17.0)
Lymphocytes Relative: 38.4 % (ref 12.0–46.0)
Lymphs Abs: 2.4 10*3/uL (ref 0.7–4.0)
MCHC: 33.5 g/dL (ref 30.0–36.0)
MCV: 90.2 fl (ref 78.0–100.0)
Monocytes Absolute: 0.7 10*3/uL (ref 0.1–1.0)
Monocytes Relative: 11.6 % (ref 3.0–12.0)
Neutro Abs: 2.6 10*3/uL (ref 1.4–7.7)
Neutrophils Relative %: 42.4 % — ABNORMAL LOW (ref 43.0–77.0)
Platelets: 218 10*3/uL (ref 150.0–400.0)
RBC: 4.4 Mil/uL (ref 4.22–5.81)
RDW: 14 % (ref 11.5–15.5)
WBC: 6.2 10*3/uL (ref 4.0–10.5)

## 2022-06-07 LAB — PULMONARY FUNCTION TEST
DL/VA % pred: 106 %
DL/VA: 4.44 ml/min/mmHg/L
DLCO cor % pred: 93 %
DLCO cor: 20.91 ml/min/mmHg
DLCO unc % pred: 93 %
DLCO unc: 20.91 ml/min/mmHg
FEF 25-75 Post: 2.84 L/sec
FEF 25-75 Pre: 2.45 L/sec
FEF2575-%Change-Post: 15 %
FEF2575-%Pred-Post: 133 %
FEF2575-%Pred-Pre: 115 %
FEV1-%Change-Post: 3 %
FEV1-%Pred-Post: 90 %
FEV1-%Pred-Pre: 87 %
FEV1-Post: 2.46 L
FEV1-Pre: 2.38 L
FEV1FVC-%Change-Post: 0 %
FEV1FVC-%Pred-Pre: 110 %
FEV6-%Change-Post: 4 %
FEV6-%Pred-Post: 86 %
FEV6-%Pred-Pre: 83 %
FEV6-Post: 3.01 L
FEV6-Pre: 2.89 L
FEV6FVC-%Change-Post: 0 %
FEV6FVC-%Pred-Post: 106 %
FEV6FVC-%Pred-Pre: 105 %
FVC-%Change-Post: 3 %
FVC-%Pred-Post: 81 %
FVC-%Pred-Pre: 79 %
FVC-Post: 3.01 L
Post FEV1/FVC ratio: 82 %
Post FEV6/FVC ratio: 100 %
Pre FEV1/FVC ratio: 82 %
Pre FEV6/FVC Ratio: 99 %
RV % pred: 68 %
RV: 1.46 L
TLC % pred: 76 %
TLC: 4.58 L

## 2022-06-07 LAB — POCT EXHALED NITRIC OXIDE: FeNO level (ppb): 32

## 2022-06-07 NOTE — Progress Notes (Signed)
$@Patientw$  ID: Rodney Maynard., male    DOB: 05-27-1955, 67 y.o.   MRN: [id number]  Chief Complaint  Patient presents with   Follow-up    Pt f/u to discuss PFT results, he states that he is feeling well.     Referring provider: [name], MD  HPI: 67 year old male, former remote smoker referred for wheezing and DOE in January 2024. He was seen by Dr. Valeta Harms for initial pulmonary consult. Past medical history significant for HTN, prostate cancer 2012, obesity, heart murmur.   TEST/EVENTS:  03/21/2022 CXR: both lungs are clear 06/07/2022 PFT: FVC 79, FEV1 87, ratio 82, TLC 76, DLCOcor 93  04/26/2021: OV with Dr. Valeta Harms for initial pulmonary consult.  Recently seen by primary care provider Dr. Deforest Hoyles with complaints of cough, wheezing at nighttime and occasional shortness of breath.  Does notice that his wheezing and chest tightness gets worse when he is cooking in the kitchen and there is a lot of smoke or extra steam.  He does have children that have asthma.  He has never had asthma symptoms before.  He used to smoke a max of 3 packs/day but he quit when he was 67 years old.  Previously worked in [employer] consultant; now retired.  Also briefly worked in the operating room.  He has not had any pulmonary function testing in the past.  Not convinced that he smoked long enough to have COPD.  Could be dealing with more of a reactive airway disease.  Empirically started on Breo 100.  Plan for full PFT.  06/07/2022: Today - follow up Patient presents today for follow up after pulmonary function testing. He has a mild to moderate restrictive defect; no obstruction and no significant BD. His diffusion capacity was normal. His previous symptoms of cough, wheezing and chest tightness have resolved with use of the Breo inhaler. He started feeling better after a week or so of use. He feels like he can complete all activities without difficulties breathing. He has never been diagnosed with asthma before. He  does have three children with asthma. His symptoms seemed to have started having URI months ago. He does tell me that he has allergy symptoms, especially during spring, and has dealt with these for many years. He has never had any allergy testing completed. He has not had any significant side effects with the Breo. He has not required albuterol since he started the [facility]  FeNO 31 ppb  Allergies  Allergen Reactions   Latex Itching    Immunization History  Administered Date(s) Administered   Fluad Quad(high Dose 65+) 01/08/2019   Influenza,inj,Quad PF,6+ Mos 01/14/2014    Past Medical History:  Diagnosis Date   Abnormal prostate biopsy 2012   Arthritis    arthritis back-fingers, disc disease degenerative  with sciatic  pain both legs right > left.   Bilateral hydrocele    Bilateral renal cysts    Cancer (HCC)    prostate cancer, bx. 7'15, 2012 bx- was obseved until PSA showed steady increase.   Epididymal cyst    Bilateral head   Heart murmur    pt denies   High cholesterol    Hypertension    Sciatica    Testicular cyst    Right   Umbilical hernia    Wheezing    Occ.    Tobacco History: Social History   Tobacco Use  Smoking Status Former   Types: Cigarettes   Quit date: 01/11/1974   Years since  quitting: 48.4  Smokeless Tobacco Never   Counseling given: Not Answered   Outpatient Medications Prior to Visit  Medication Sig Dispense Refill   acetaminophen (ACETAMINOPHEN EXTRA STRENGTH) 500 MG tablet 1 tablet as needed     amLODipine (NORVASC) 10 MG tablet Take 10 mg by mouth every morning.     aspirin (ASPIRIN 81) 81 MG chewable tablet 1 tablet     aspirin 81 MG chewable tablet Chew 1 tablet (81 mg total) by mouth 2 (two) times daily. 60 tablet 0   Cyanocobalamin (VITAMIN B 12) 500 MCG TABS 1 tablet     diphenhydrAMINE HCl (ZZZQUIL) 50 MG/30ML LIQD Take 25 mg by mouth at bedtime as needed (sleep).     fluticasone furoate-vilanterol (BREO ELLIPTA) 100-25 MCG/ACT  AEPB Inhale 1 puff into the lungs daily. 60 each 3   gabapentin (NEURONTIN) 300 MG capsule Take 300 mg by mouth 3 (three) times daily.     hydrOXYzine (ATARAX/VISTARIL) 25 MG tablet Take 25 mg by mouth at bedtime as needed.     indomethacin (INDOCIN SR) 75 MG CR capsule Take 75 mg by mouth daily with breakfast.     lisinopril-hydrochlorothiazide (PRINZIDE,ZESTORETIC) 20-25 MG per tablet Take 1 tablet by mouth every morning.      meloxicam (MOBIC) 15 MG tablet Take 1 tablet by mouth daily.     methocarbamol (ROBAXIN) 500 MG tablet TAKE ONE TABLET BY MOUTH EVERY 6 HOURS AS NEEDED FOR MUSCLE SPASMS 50 tablet 0   metoprolol (TOPROL-XL) 200 MG 24 hr tablet Take 100 mg by mouth 2 (two) times daily.     metoprolol tartrate (LOPRESSOR) 50 MG tablet Take by mouth.     Multiple Vitamin (MULTIVITAMINS PO) 1 tablet     naproxen sodium (ALEVE) 220 MG tablet Take 220 mg by mouth at bedtime as needed (pain).     Omega-3 Fatty Acids (FISH OIL PO) Take 520 mg by mouth daily.     oxyCODONE (OXY IR/ROXICODONE) 5 MG immediate release tablet Take 1-2 tablets (5-10 mg total) by mouth every 4 (four) hours as needed for moderate pain (pain score 4-6). 50 tablet 0   triamcinolone ointment (KENALOG) 0.1 % 1 application     VENTOLIN HFA 108 (90 Base) MCG/ACT inhaler Inhale 1-2 puffs into the lungs every 4 (four) hours as needed for wheezing or shortness of breath. 18 g 3   white petrolatum (VASELINE) GEL Apply 1 application topically as needed (itching).     No facility-administered medications prior to visit.     Review of Systems:   Constitutional: No weight loss or gain, night sweats, fevers, chills, fatigue, or lassitude. HEENT: No headaches, difficulty swallowing, tooth/dental problems, or sore throat, ear ache. +nasal congestion, sneezing, itchy/watery eyes (only during allergy season) CV:  No chest pain, orthopnea, PND, swelling in lower extremities, anasarca, dizziness, palpitations, syncope Resp: Resolved  cough and wheezing. No shortness of breath with exertion or at rest. No hemoptysis.  No chest wall deformity GI:  No heartburn, indigestion, abdominal pain, nausea, vomiting, diarrhea, change in bowel habits, loss of appetite, bloody stools.  GU: No dysuria, change in color of urine, urgency or frequency.   Skin: No rash, lesions, ulcerations MSK:  No joint pain or swelling.   Neuro: No dizziness or lightheadedness.  Psych: No depression or anxiety. Mood stable.     Physical Exam:  BP (!) 140/82   Pulse 74   Ht 5' 6"$  (1.676 m)   Wt 215 lb 3.2 oz (97.6 kg)  SpO2 97%   BMI 34.73 kg/m   GEN: Pleasant, interactive, well-appearing; obese; in no acute distress. HEENT:  Normocephalic and atraumatic. PERRLA. Sclera white. Nasal turbinates pink, moist and patent bilaterally. No rhinorrhea present. Oropharynx pink and moist, without exudate or edema. No lesions, ulcerations, or postnasal drip.  NECK:  Supple w/ fair ROM. No JVD present. Normal carotid impulses w/o bruits. Thyroid symmetrical with no goiter or nodules palpated. No lymphadenopathy.   CV: RRR, no m/r/g, no peripheral edema. Pulses intact, +2 bilaterally. No cyanosis, pallor or clubbing. PULMONARY:  Unlabored, regular breathing. Clear bilaterally A&P w/o wheezes/rales/rhonchi. No accessory muscle use.  GI: BS present and normoactive. Soft, non-tender to palpation. No organomegaly or masses detected. MSK: No erythema, warmth or tenderness. Cap refil <2 sec all extrem. No deformities or joint swelling noted.  Neuro: A/Ox3. No focal deficits noted.   Skin: Warm, no lesions or rashe Psych: Normal affect and behavior. Judgement and thought content appropriate.     Lab Results:  CBC    Component Value Date/Time   WBC 12.0 (H) 11/02/2017 0928   RBC 3.88 (L) 11/02/2017 0928   HGB 12.1 (L) 11/02/2017 0928   HCT 35.2 (L) 11/02/2017 0928   PLT 242 11/02/2017 0928   MCV 90.7 11/02/2017 0928   MCH 31.2 11/02/2017 0928   MCHC 34.4  11/02/2017 0928   RDW 12.9 11/02/2017 0928   LYMPHSABS 2.4 02/27/2010 2117   MONOABS 0.6 02/27/2010 2117   EOSABS 0.2 02/27/2010 2117   BASOSABS 0.0 02/27/2010 2117    BMET    Component Value Date/Time   NA 138 11/02/2017 0459   K 3.9 11/02/2017 0459   CL 103 11/02/2017 0459   CO2 27 11/02/2017 0459   GLUCOSE 178 (H) 11/02/2017 0459   BUN 18 11/02/2017 0459   CREATININE 1.13 11/02/2017 0459   CALCIUM 8.7 (L) 11/02/2017 0459   GFRNONAA >60 11/02/2017 0459   GFRAA >60 11/02/2017 0459    BNP No results found for: "BNP"   Imaging:  No results found.       Latest Ref Rng & Units 06/07/2022   11:14 AM  PFT Results  FVC-Predicted Pre % 79  P  FVC-Post L 3.01  P  FVC-Predicted Post % 81  P  Pre FEV1/FVC % % 82  P  Post FEV1/FCV % % 82  P  FEV1-Pre L 2.38  P  FEV1-Predicted Pre % 87  P  FEV1-Post L 2.46  P  DLCO uncorrected ml/min/mmHg 20.91  P  DLCO UNC% % 93  P  DLCO corrected ml/min/mmHg 20.91  P  DLCO COR %Predicted % 93  P  DLVA Predicted % 106  P  TLC L 4.58  P  TLC % Predicted % 76  P  RV % Predicted % 68  P    P Preliminary result    No results found for: "NITRICOXIDE"      Assessment & Plan:   Mild persistent asthma Constellation of symptoms with elevated exhaled nitric oxide, allergic history, and response to ICS are consistent with underlying asthma diagnosis. Possible onset with URI. May be able to de-escalate him in the future to as needed ICS/SABA regimen and monitor symptoms moving forward. In the interim, we will continue him on ICS/LABA to ensure he has at least 3 months stability following exacerbation. Check CBC with diff to assess for eosinophilia today and allergen panel with IgE to determine allergic phenotype. Asthma action plan in place.   Patient Instructions  Continue Albuterol inhaler 2 puffs every 6 hours as needed for shortness of breath or wheezing. Notify if symptoms persist despite rescue inhaler/neb use.  Continue Breo 1  puff daily. Brush tongue and rinse mouth afterwards  Labs today  Follow up in 4 months with Dr. Valeta Harms or Joellen Jersey Christerpher Clos,NP. If symptoms do not improve or worsen, please contact office for sooner follow up or seek emergency care.    Obesity (BMI 30-39.9) BMI 34.7. Obesity likely cause of restrictive defect on PFTs today as DLCO is nl. He has resolution of respiratory symptoms with ICS/LABA therapy and previous plain films have been clear. Given this as well as no reduction in diffusing capacity, low suspicion for underlying ILD. If he develops worsening respiratory symptoms down the road, could consider further evaluation with HRCT.    I spent 35 minutes of dedicated to the care of this patient on the date of this encounter to include pre-visit review of records, face-to-face time with the patient discussing conditions above, post visit ordering of testing, clinical documentation with the electronic health record, making appropriate referrals as documented, and communicating necessary findings to members of the patients care team.  Clayton Bibles, NP 06/07/2022  Pt aware and understands NP's role.

## 2022-06-07 NOTE — Assessment & Plan Note (Signed)
Constellation of symptoms with elevated exhaled nitric oxide, allergic history, and response to ICS are consistent with underlying asthma diagnosis. Possible onset with URI. May be able to de-escalate him in the future to as needed ICS/SABA regimen and monitor symptoms moving forward. In the interim, we will continue him on ICS/LABA to ensure he has at least 3 months stability following exacerbation. Check CBC with diff to assess for eosinophilia today and allergen panel with IgE to determine allergic phenotype. Asthma action plan in place.   Patient Instructions  Continue Albuterol inhaler 2 puffs every 6 hours as needed for shortness of breath or wheezing. Notify if symptoms persist despite rescue inhaler/neb use.  Continue Breo 1 puff daily. Brush tongue and rinse mouth afterwards  Labs today  Follow up in 4 months with Dr. Valeta Harms or Joellen Jersey Glena Pharris,NP. If symptoms do not improve or worsen, please contact office for sooner follow up or seek emergency care.

## 2022-06-07 NOTE — Progress Notes (Signed)
Full PFT completed today 

## 2022-06-07 NOTE — Assessment & Plan Note (Signed)
BMI 34.7. Obesity likely cause of restrictive defect on PFTs today as DLCO is nl. He has resolution of respiratory symptoms with ICS/LABA therapy and previous plain films have been clear. Given this as well as no reduction in diffusing capacity, low suspicion for underlying ILD. If he develops worsening respiratory symptoms down the road, could consider further evaluation with HRCT.

## 2022-06-07 NOTE — Patient Instructions (Addendum)
Continue Albuterol inhaler 2 puffs every 6 hours as needed for shortness of breath or wheezing. Notify if symptoms persist despite rescue inhaler/neb use.  Continue Breo 1 puff daily. Brush tongue and rinse mouth afterwards  Labs today  Follow up in 4 months with Dr. Valeta Harms or Joellen Jersey Samera Macy,NP. If symptoms do not improve or worsen, please contact office for sooner follow up or seek emergency care.

## 2022-06-11 LAB — ALLERGEN PANEL (27) + IGE
Alternaria Alternata IgE: <0.1 kU/L
Aspergillus Fumigatus IgE: <0.1 kU/L
Bahia Grass IgE: <0.1 kU/L
Bermuda Grass IgE: <0.1 kU/L
Cat Dander IgE: <0.1 kU/L
Cedar, Mountain IgE: <0.1 kU/L
Cladosporium Herbarum IgE: <0.1 kU/L
Cocklebur IgE: <0.1 kU/L
Cockroach, American IgE: <0.1 kU/L
Common Silver Birch IgE: <0.1 kU/L
D Farinae IgE: <0.1 kU/L
D Pteronyssinus IgE: <0.1 kU/L
Dog Dander IgE: <0.1 kU/L
Elm, American IgE: <0.1 kU/L
Hickory, White IgE: 0.17 kU/L — AB
IgE (Immunoglobulin E), Serum: 37 IU/mL (ref 6–495)
Johnson Grass IgE: <0.1 kU/L
Kentucky Bluegrass IgE: <0.1 kU/L
Maple/Box Elder IgE: <0.1 kU/L
Mucor Racemosus IgE: <0.1 kU/L
Oak, White IgE: <0.1 kU/L
Penicillium Chrysogen IgE: <0.1 kU/L
Pigweed, Rough IgE: <0.1 kU/L
Plantain, English IgE: <0.1 kU/L
Ragweed, Short IgE: <0.1 kU/L
Setomelanomma Rostrat: <0.1 kU/L
Timothy Grass IgE: <0.1 kU/L
White Mulberry IgE: <0.1 kU/L

## 2022-08-28 ENCOUNTER — Ambulatory Visit
Admission: RE | Admit: 2022-08-28 | Discharge: 2022-08-28 | Disposition: A | Discharge status: Home / Self Care | Payer: Medicare HMO | Source: Ambulatory Visit | Attending: Internal Medicine | Admitting: Internal Medicine

## 2022-08-28 ENCOUNTER — Other Ambulatory Visit: Payer: Self-pay | Admitting: Internal Medicine

## 2022-08-28 DIAGNOSIS — M25551 Pain in right hip: Secondary | ICD-10-CM

## 2022-08-28 DIAGNOSIS — R109 Unspecified abdominal pain: Secondary | ICD-10-CM | POA: Diagnosis not present

## 2022-10-01 ENCOUNTER — Other Ambulatory Visit: Payer: Self-pay | Admitting: Pulmonary Disease

## 2022-10-08 ENCOUNTER — Encounter: Payer: Self-pay | Admitting: Nurse Practitioner

## 2022-10-08 ENCOUNTER — Ambulatory Visit: Payer: Medicare HMO | Admitting: Nurse Practitioner

## 2022-10-10 ENCOUNTER — Encounter: Payer: Self-pay | Admitting: Nurse Practitioner

## 2022-10-10 ENCOUNTER — Ambulatory Visit (INDEPENDENT_AMBULATORY_CARE_PROVIDER_SITE_OTHER): Payer: Medicare HMO | Admitting: Nurse Practitioner

## 2022-10-10 VITALS — BP 134/72 | HR 56 | Ht 66.0 in | Wt 213.2 lb

## 2022-10-10 DIAGNOSIS — J4530 Mild persistent asthma, uncomplicated: Secondary | ICD-10-CM

## 2022-10-10 NOTE — Progress Notes (Signed)
@Patient  ID: Rodney Bulls., male    DOB: 1956-04-02, 67 y.o.   MRN: [id number]  Chief Complaint  Patient presents with   Follow-up    Pt has no concerns, has been using his breo inhaler.     Referring provider: Georgann Housekeeper, MD  HPI: 67 year old male, former remote smoker followed for asthma. He was last seen in office 06/07/2022. Past medical history significant for HTN, prostate cancer 2012, obesity, heart murmur.   TEST/EVENTS:  03/21/2022 CXR: both lungs are clear 06/07/2022 PFT: FVC 79, FEV1 87, ratio 82, TLC 76, DLCOcor 93  04/26/2021: OV with Dr. Tonia Brooms for initial pulmonary consult.  Recently seen by primary care provider Dr. Eula Listen with complaints of cough, wheezing at nighttime and occasional shortness of breath.  Does notice that his wheezing and chest tightness gets worse when he is cooking in the kitchen and there is a lot of smoke or extra steam.  He does have children that have asthma.  He has never had asthma symptoms before.  He used to smoke a max of 3 packs/day but he quit when he was 67 years old.  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  Also briefly worked in the operating room.  He has not had any pulmonary function testing in the past.  Not convinced that he smoked long enough to have COPD.  Could be dealing with more of a reactive airway disease.  Empirically started on Breo 100.  Plan for full PFT.  06/07/2022: OV with Jehieli Brassell NP for follow up after pulmonary function testing. He has a mild to moderate restrictive defect; no obstruction and no significant BD. His diffusion capacity was normal. His previous symptoms of cough, wheezing and chest tightness have resolved with use of the Breo inhaler. He started feeling better after a week or so of use. He feels like he can complete all activities without difficulties breathing. He has never been diagnosed with asthma before. He does have three children with asthma. His symptoms seemed to have started having URI  months ago. He does tell me that he has allergy symptoms, especially during spring, and has dealt with these for many years. He has never had any allergy testing completed. He has not had any significant side effects with the Breo. He has not required albuterol since he started the Breo FeNO 31 ppb  10/10/2022: Today - follow up Patient presents today for follow up. He has been doing very well since he was here last. The only time he ever has to use his rescue is after he's outside cutting the grass or working in the yard. His allergies seem to be more prevalent then too. No current antihistamines. No cough, wheezing, chest congestion. He has not had any exacerbations requiring steroids or abx.   Allergies  Allergen Reactions   Latex Itching    Immunization History  Administered Date(s) Administered   Fluad Quad(high Dose 65+) 01/08/2019   Influenza,inj,Quad PF,6+ Mos 01/14/2014    Past Medical History:  Diagnosis Date   Abnormal prostate biopsy 2012   Arthritis    arthritis back-fingers, disc disease degenerative  with sciatic  pain both legs right > left.   Bilateral hydrocele    Bilateral renal cysts    Cancer (HCC)    prostate cancer, bx. 7'15, 2012 bx- was obseved until PSA showed steady increase.   Epididymal cyst    Bilateral head   Heart murmur    pt denies   High  cholesterol    Hypertension    Sciatica    Testicular cyst    Right   Umbilical hernia    Wheezing    Occ.    Tobacco History: Social History   Tobacco Use  Smoking Status Former   Types: Cigarettes   Quit date: 01/11/1974   Years since quitting: 48.7  Smokeless Tobacco Never   Counseling given: Not Answered   Outpatient Medications Prior to Visit  Medication Sig Dispense Refill   acetaminophen (ACETAMINOPHEN EXTRA STRENGTH) 500 MG tablet 1 tablet as needed     amLODipine (NORVASC) 10 MG tablet Take 10 mg by mouth every morning.     aspirin (ASPIRIN 81) 81 MG chewable tablet 1 tablet      aspirin 81 MG chewable tablet Chew 1 tablet (81 mg total) by mouth 2 (two) times daily. 60 tablet 0   BREO ELLIPTA 100-25 MCG/ACT AEPB INHALE 1 PUFF BY MOUTH INTO LUNGS DAILY 60 each 3   Cyanocobalamin (VITAMIN B 12) 500 MCG TABS 1 tablet     diphenhydrAMINE HCl (ZZZQUIL) 50 MG/30ML LIQD Take 25 mg by mouth at bedtime as needed (sleep).     gabapentin (NEURONTIN) 300 MG capsule Take 300 mg by mouth 3 (three) times daily.     hydrOXYzine (ATARAX/VISTARIL) 25 MG tablet Take 25 mg by mouth at bedtime as needed.     indomethacin (INDOCIN SR) 75 MG CR capsule Take 75 mg by mouth daily with breakfast.     lisinopril-hydrochlorothiazide (PRINZIDE,ZESTORETIC) 20-25 MG per tablet Take 1 tablet by mouth every morning.      meloxicam (MOBIC) 15 MG tablet Take 1 tablet by mouth daily.     methocarbamol (ROBAXIN) 500 MG tablet TAKE ONE TABLET BY MOUTH EVERY 6 HOURS AS NEEDED FOR MUSCLE SPASMS 50 tablet 0   metoprolol (TOPROL-XL) 200 MG 24 hr tablet Take 100 mg by mouth 2 (two) times daily.     metoprolol tartrate (LOPRESSOR) 50 MG tablet Take by mouth.     Multiple Vitamin (MULTIVITAMINS PO) 1 tablet     naproxen sodium (ALEVE) 220 MG tablet Take 220 mg by mouth at bedtime as needed (pain).     Omega-3 Fatty Acids (FISH OIL PO) Take 520 mg by mouth daily.     oxyCODONE (OXY IR/ROXICODONE) 5 MG immediate release tablet Take 1-2 tablets (5-10 mg total) by mouth every 4 (four) hours as needed for moderate pain (pain score 4-6). 50 tablet 0   triamcinolone ointment (KENALOG) 0.1 % 1 application     VENTOLIN HFA 108 (90 Base) MCG/ACT inhaler Inhale 1-2 puffs into the lungs every 4 (four) hours as needed for wheezing or shortness of breath. 18 g 3   white petrolatum (VASELINE) GEL Apply 1 application topically as needed (itching).     No facility-administered medications prior to visit.     Review of Systems:   Constitutional: No weight loss or gain, night sweats, fevers, chills, fatigue, or  lassitude. HEENT: No headaches, difficulty swallowing, tooth/dental problems, or sore throat, ear ache. +nasal congestion, sneezing, itchy/watery eyes (after outside work) CV:  No chest pain, orthopnea, PND, swelling in lower extremities, anasarca, dizziness, palpitations, syncope Resp: +shortness of breath after yard work. No cough. No wheezing. No hemoptysis.  No chest wall deformity GI:  No heartburn, indigestion, abdominal pain, nausea, vomiting, diarrhea, change in bowel habits, loss of appetite, bloody stools.  GU: No dysuria, change in color of urine, urgency or frequency.   Skin: No rash, lesions,  ulcerations MSK:  No joint pain or swelling.   Neuro: No dizziness or lightheadedness.  Psych: No depression or anxiety. Mood stable.     Physical Exam:  BP 134/72   Pulse (!) 56   Ht 5\' 6"  (1.676 m)   Wt 213 lb 3.2 oz (96.7 kg)   SpO2 98%   BMI 34.41 kg/m   GEN: Pleasant, interactive, well-appearing; obese; in no acute distress. HEENT:  Normocephalic and atraumatic. PERRLA. Sclera white. Nasal turbinates pink, moist and patent bilaterally. No rhinorrhea present. Oropharynx pink and moist, without exudate or edema. No lesions, ulcerations, or postnasal drip.  NECK:  Supple w/ fair ROM. No JVD present. Normal carotid impulses w/o bruits. Thyroid symmetrical with no goiter or nodules palpated. No lymphadenopathy.   CV: RRR, no m/r/g, no peripheral edema. Pulses intact, +2 bilaterally. No cyanosis, pallor or clubbing. PULMONARY:  Unlabored, regular breathing. Clear bilaterally A&P w/o wheezes/rales/rhonchi. No accessory muscle use.  GI: BS present and normoactive. Soft, non-tender to palpation. No organomegaly or masses detected. MSK: No erythema, warmth or tenderness. Cap refil <2 sec all extrem. No deformities or joint swelling noted.  Neuro: A/Ox3. No focal deficits noted.   Skin: Warm, no lesions or rashe Psych: Normal affect and behavior. Judgement and thought content appropriate.      Lab Results:  CBC    Component Value Date/Time   WBC 6.2 06/07/2022 1134   RBC 4.40 06/07/2022 1134   HGB 13.3 06/07/2022 1134   HCT 39.7 06/07/2022 1134   PLT 218.0 06/07/2022 1134   MCV 90.2 06/07/2022 1134   MCH 31.2 11/02/2017 0928   MCHC 33.5 06/07/2022 1134   RDW 14.0 06/07/2022 1134   LYMPHSABS 2.4 06/07/2022 1134   MONOABS 0.7 06/07/2022 1134   EOSABS 0.4 06/07/2022 1134   BASOSABS 0.0 06/07/2022 1134    BMET    Component Value Date/Time   NA 138 11/02/2017 0459   K 3.9 11/02/2017 0459   CL 103 11/02/2017 0459   CO2 27 11/02/2017 0459   GLUCOSE 178 (H) 11/02/2017 0459   BUN 18 11/02/2017 0459   CREATININE 1.13 11/02/2017 0459   CALCIUM 8.7 (L) 11/02/2017 0459   GFRNONAA >60 11/02/2017 0459   GFRAA >60 11/02/2017 0459    BNP No results found for: "BNP"   Imaging:  No results found.       Latest Ref Rng & Units 06/07/2022    4:17 PM  PFT Results  FVC-Predicted Pre % 79   FVC-Post L 3.01   FVC-Predicted Post % 81   Pre FEV1/FVC % % 82   Post FEV1/FCV % % 82   FEV1-Pre L 2.38   FEV1-Predicted Pre % 87   FEV1-Post L 2.46   DLCO uncorrected ml/min/mmHg 20.91   DLCO UNC% % 93   DLCO corrected ml/min/mmHg 20.91   DLCO COR %Predicted % 93   DLVA Predicted % 106   TLC L 4.58   TLC % Predicted % 76   RV % Predicted % 68     No results found for: "NITRICOXIDE"      Assessment & Plan:   Mild persistent asthma Compensated on current regimen. Minimal use of SABA aside from after he is outside. Add antihistamine for trigger prevention. Action plan in place. Encouraged to remain active.   Patient Instructions  Continue Albuterol inhaler 2 puffs every 6 hours as needed for shortness of breath or wheezing. Notify if symptoms persist despite rescue inhaler/neb use.  Continue Breo 1 puff  daily. Brush tongue and rinse mouth afterwards  Use a daily allergy pill if you are going to be working outside   Follow up in 4 months with Dr. Tonia Brooms or  Rodney Addison Shatana Saxton,NP. If symptoms do not improve or worsen, please contact office for sooner follow up or seek emergency care.     I spent 35 minutes of dedicated to the care of this patient on the date of this encounter to include pre-visit review of records, face-to-face time with the patient discussing conditions above, post visit ordering of testing, clinical documentation with the electronic health record, making appropriate referrals as documented, and communicating necessary findings to members of the patients care team.  Noemi Chapel, NP 10/10/2022  Pt aware and understands NP's role.

## 2022-10-10 NOTE — Assessment & Plan Note (Signed)
Compensated on current regimen. Minimal use of SABA aside from after he is outside. Add antihistamine for trigger prevention. Action plan in place. Encouraged to remain active.   Patient Instructions  Continue Albuterol inhaler 2 puffs every 6 hours as needed for shortness of breath or wheezing. Notify if symptoms persist despite rescue inhaler/neb use.  Continue Breo 1 puff daily. Brush tongue and rinse mouth afterwards  Use a daily allergy pill if you are going to be working outside   Follow up in 4 months with Dr. Tonia Brooms or Florentina Addison Dajuana Palen,NP. If symptoms do not improve or worsen, please contact office for sooner follow up or seek emergency care.

## 2022-10-10 NOTE — Patient Instructions (Addendum)
Continue Albuterol inhaler 2 puffs every 6 hours as needed for shortness of breath or wheezing. Notify if symptoms persist despite rescue inhaler/neb use.  Continue Breo 1 puff daily. Brush tongue and rinse mouth afterwards  Use a daily allergy pill if you are going to be working outside   Follow up in 4 months with Dr. Tonia Brooms or Florentina Addison Valeria Boza,NP. If symptoms do not improve or worsen, please contact office for sooner follow up or seek emergency care.

## 2022-11-17 DIAGNOSIS — M7051 Other bursitis of knee, right knee: Secondary | ICD-10-CM | POA: Diagnosis not present

## 2022-11-20 DIAGNOSIS — B360 Pityriasis versicolor: Secondary | ICD-10-CM | POA: Diagnosis not present

## 2022-11-20 DIAGNOSIS — I10 Essential (primary) hypertension: Secondary | ICD-10-CM | POA: Diagnosis not present

## 2022-11-20 DIAGNOSIS — R21 Rash and other nonspecific skin eruption: Secondary | ICD-10-CM | POA: Diagnosis not present

## 2022-11-29 DIAGNOSIS — R059 Cough, unspecified: Secondary | ICD-10-CM | POA: Diagnosis not present

## 2022-11-29 DIAGNOSIS — J4530 Mild persistent asthma, uncomplicated: Secondary | ICD-10-CM | POA: Diagnosis not present

## 2022-11-29 DIAGNOSIS — Z6835 Body mass index (BMI) 35.0-35.9, adult: Secondary | ICD-10-CM | POA: Diagnosis not present

## 2022-12-12 DIAGNOSIS — I771 Stricture of artery: Secondary | ICD-10-CM | POA: Diagnosis not present

## 2022-12-12 DIAGNOSIS — Z8546 Personal history of malignant neoplasm of prostate: Secondary | ICD-10-CM | POA: Diagnosis not present

## 2022-12-12 DIAGNOSIS — K429 Umbilical hernia without obstruction or gangrene: Secondary | ICD-10-CM | POA: Diagnosis not present

## 2022-12-12 DIAGNOSIS — I10 Essential (primary) hypertension: Secondary | ICD-10-CM | POA: Diagnosis not present

## 2022-12-12 DIAGNOSIS — M519 Unspecified thoracic, thoracolumbar and lumbosacral intervertebral disc disorder: Secondary | ICD-10-CM | POA: Diagnosis not present

## 2022-12-12 DIAGNOSIS — K4090 Unilateral inguinal hernia, without obstruction or gangrene, not specified as recurrent: Secondary | ICD-10-CM | POA: Diagnosis not present

## 2022-12-12 DIAGNOSIS — Z23 Encounter for immunization: Secondary | ICD-10-CM | POA: Diagnosis not present

## 2022-12-12 DIAGNOSIS — Z1331 Encounter for screening for depression: Secondary | ICD-10-CM | POA: Diagnosis not present

## 2022-12-12 DIAGNOSIS — N182 Chronic kidney disease, stage 2 (mild): Secondary | ICD-10-CM | POA: Diagnosis not present

## 2022-12-12 DIAGNOSIS — J4530 Mild persistent asthma, uncomplicated: Secondary | ICD-10-CM | POA: Diagnosis not present

## 2022-12-12 DIAGNOSIS — Z0000 Encounter for general adult medical examination without abnormal findings: Secondary | ICD-10-CM | POA: Diagnosis not present

## 2022-12-12 DIAGNOSIS — E782 Mixed hyperlipidemia: Secondary | ICD-10-CM | POA: Diagnosis not present

## 2022-12-12 DIAGNOSIS — R7303 Prediabetes: Secondary | ICD-10-CM | POA: Diagnosis not present

## 2023-02-09 DIAGNOSIS — Z23 Encounter for immunization: Secondary | ICD-10-CM | POA: Diagnosis not present

## 2023-02-15 DIAGNOSIS — N5231 Erectile dysfunction following radical prostatectomy: Secondary | ICD-10-CM | POA: Diagnosis not present

## 2023-02-15 DIAGNOSIS — C61 Malignant neoplasm of prostate: Secondary | ICD-10-CM | POA: Diagnosis not present

## 2023-03-12 ENCOUNTER — Ambulatory Visit: Payer: Medicare HMO | Admitting: Nurse Practitioner

## 2023-03-12 ENCOUNTER — Encounter: Payer: Self-pay | Admitting: Nurse Practitioner

## 2023-03-12 VITALS — BP 140/78 | HR 62 | Ht 66.0 in | Wt 215.6 lb

## 2023-03-12 DIAGNOSIS — J301 Allergic rhinitis due to pollen: Secondary | ICD-10-CM

## 2023-03-12 DIAGNOSIS — J309 Allergic rhinitis, unspecified: Secondary | ICD-10-CM | POA: Insufficient documentation

## 2023-03-12 DIAGNOSIS — J4530 Mild persistent asthma, uncomplicated: Secondary | ICD-10-CM

## 2023-03-12 NOTE — Progress Notes (Unsigned)
@Patient  ID: Rodney Maynard., male    DOB: Apr 07, 1956, 67 y.o.   MRN: [id number]  Chief Complaint  Patient presents with   Follow-up    Asthma F/U visit.    Referring provider: Georgann Housekeeper, MD  HPI: 67 year old male, former remote smoker followed for asthma. He was last seen in office 10/10/2022. Past medical history significant for HTN, prostate cancer 2012, obesity, heart murmur.   TEST/EVENTS:  03/21/2022 CXR: both lungs are clear 06/07/2022 PFT: FVC 79, FEV1 87, ratio 82, TLC 76, DLCOcor 93  04/26/2021: OV with Dr. Tonia Brooms for initial pulmonary consult.  Recently seen by primary care provider Dr. Eula Listen with complaints of cough, wheezing at nighttime and occasional shortness of breath.  Does notice that his wheezing and chest tightness gets worse when he is cooking in the kitchen and there is a lot of smoke or extra steam.  He does have children that have asthma.  He has never had asthma symptoms before.  He used to smoke a max of 3 packs/day but he quit when he was 67 years old.  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  Also briefly worked in the operating room.  He has not had any pulmonary function testing in the past.  Not convinced that he smoked long enough to have COPD.  Could be dealing with more of a reactive airway disease.  Empirically started on Breo 100.  Plan for full PFT.  06/07/2022: OV with Adekunle Rohrbach NP for follow up after pulmonary function testing. He has a mild to moderate restrictive defect; no obstruction and no significant BD. His diffusion capacity was normal. His previous symptoms of cough, wheezing and chest tightness have resolved with use of the Breo inhaler. He started feeling better after a week or so of use. He feels like he can complete all activities without difficulties breathing. He has never been diagnosed with asthma before. He does have three children with asthma. His symptoms seemed to have started having URI months ago. He does tell me that he  has allergy symptoms, especially during spring, and has dealt with these for many years. He has never had any allergy testing completed. He has not had any significant side effects with the Breo. He has not required albuterol since he started the Breo FeNO 31 ppb  10/10/2022: OV with Janera Peugh NP for follow up. He has been doing very well since he was here last. The only time he ever has to use his rescue is after he's outside cutting the grass or working in the yard. His allergies seem to be more prevalent then too. No current antihistamines. No cough, wheezing, chest congestion. He has not had any exacerbations requiring steroids or abx.   03/12/2023: Today - follow up Patient presents today for follow-up.  He has been doing very well since he was here last.  He has been able to work out in the yard without any difficulties from his breathing.  He does wear a mask when he is doing any sort of leaf work.  No significant cough, chest congestion, wheezing.  No sinus symptoms.  Has not had any exacerbations requiring steroids or antibiotics.  Has not required his rescue inhaler.  Allergies  Allergen Reactions   Latex Itching    Immunization History  Administered Date(s) Administered   Fluad Quad(high Dose 65+) 01/08/2019   Influenza,inj,Quad PF,6+ Mos 01/14/2014    Past Medical History:  Diagnosis Date   Abnormal prostate biopsy 2012  Arthritis    arthritis back-fingers, disc disease degenerative  with sciatic  pain both legs right > left.   Bilateral hydrocele    Bilateral renal cysts    Cancer (HCC)    prostate cancer, bx. 7'15, 2012 bx- was obseved until PSA showed steady increase.   Epididymal cyst    Bilateral head   Heart murmur    pt denies   High cholesterol    Hypertension    Sciatica    Testicular cyst    Right   Umbilical hernia    Wheezing    Occ.    Tobacco History: Social History   Tobacco Use  Smoking Status Former   Current packs/day: 0.00   Types: Cigarettes    Quit date: 01/11/1974   Years since quitting: 49.1  Smokeless Tobacco Never   Counseling given: Not Answered   Outpatient Medications Prior to Visit  Medication Sig Dispense Refill   acetaminophen (ACETAMINOPHEN EXTRA STRENGTH) 500 MG tablet 1 tablet as needed     amLODipine (NORVASC) 10 MG tablet Take 10 mg by mouth every morning.     aspirin (ASPIRIN 81) 81 MG chewable tablet 1 tablet     aspirin 81 MG chewable tablet Chew 1 tablet (81 mg total) by mouth 2 (two) times daily. 60 tablet 0   BREO ELLIPTA 100-25 MCG/ACT AEPB INHALE 1 PUFF BY MOUTH INTO LUNGS DAILY 60 each 3   Cyanocobalamin (VITAMIN B 12) 500 MCG TABS 1 tablet     diphenhydrAMINE HCl (ZZZQUIL) 50 MG/30ML LIQD Take 25 mg by mouth at bedtime as needed (sleep).     gabapentin (NEURONTIN) 300 MG capsule Take 300 mg by mouth 3 (three) times daily.     hydrOXYzine (ATARAX/VISTARIL) 25 MG tablet Take 25 mg by mouth at bedtime as needed.     indomethacin (INDOCIN SR) 75 MG CR capsule Take 75 mg by mouth daily with breakfast.     lisinopril-hydrochlorothiazide (PRINZIDE,ZESTORETIC) 20-25 MG per tablet Take 1 tablet by mouth every morning.      meloxicam (MOBIC) 15 MG tablet Take 1 tablet by mouth daily.     methocarbamol (ROBAXIN) 500 MG tablet TAKE ONE TABLET BY MOUTH EVERY 6 HOURS AS NEEDED FOR MUSCLE SPASMS 50 tablet 0   metoprolol (TOPROL-XL) 200 MG 24 hr tablet Take 100 mg by mouth 2 (two) times daily.     metoprolol tartrate (LOPRESSOR) 50 MG tablet Take by mouth.     Multiple Vitamin (MULTIVITAMINS PO) 1 tablet     naproxen sodium (ALEVE) 220 MG tablet Take 220 mg by mouth at bedtime as needed (pain).     Omega-3 Fatty Acids (FISH OIL PO) Take 520 mg by mouth daily.     oxyCODONE (OXY IR/ROXICODONE) 5 MG immediate release tablet Take 1-2 tablets (5-10 mg total) by mouth every 4 (four) hours as needed for moderate pain (pain score 4-6). 50 tablet 0   triamcinolone ointment (KENALOG) 0.1 % 1 application     VENTOLIN HFA 108  (90 Base) MCG/ACT inhaler Inhale 1-2 puffs into the lungs every 4 (four) hours as needed for wheezing or shortness of breath. 18 g 3   white petrolatum (VASELINE) GEL Apply 1 application topically as needed (itching).     No facility-administered medications prior to visit.     Review of Systems:   Constitutional: No weight loss or gain, night sweats, fevers, chills, fatigue, or lassitude. HEENT: No headaches, difficulty swallowing, tooth/dental problems, or sore throat, ear ache. +nasal congestion,  sneezing, itchy/watery eyes (after outside work; improved with allergy pill) CV:  No chest pain, orthopnea, PND, swelling in lower extremities, anasarca, dizziness, palpitations, syncope Resp: +shortness of breath after yard work. No cough. No wheezing. No hemoptysis.  No chest wall deformity GI:  No heartburn, indigestion, abdominal pain, nausea, vomiting, diarrhea, change in bowel habits, loss of appetite, bloody stools.  GU: No dysuria, change in color of urine, urgency or frequency.   Skin: No rash, lesions, ulcerations MSK:  No joint pain or swelling.   Neuro: No dizziness or lightheadedness.  Psych: No depression or anxiety. Mood stable.     Physical Exam:  BP (!) 140/78 (BP Location: Right Arm, Cuff Size: Large)   Pulse 62   Ht 5\' 6"  (1.676 m)   Wt 215 lb 9.6 oz (97.8 kg)   SpO2 94%   BMI 34.80 kg/m   GEN: Pleasant, interactive, well-appearing; obese; in no acute distress. HEENT:  Normocephalic and atraumatic. PERRLA. Sclera white. Nasal turbinates pink, moist and patent bilaterally. No rhinorrhea present. Oropharynx pink and moist, without exudate or edema. No lesions, ulcerations, or postnasal drip.  NECK:  Supple w/ fair ROM. No JVD present. Normal carotid impulses w/o bruits. Thyroid symmetrical with no goiter or nodules palpated. No lymphadenopathy.   CV: RRR, no m/r/g, no peripheral edema. Pulses intact, +2 bilaterally. No cyanosis, pallor or clubbing. PULMONARY:   Unlabored, regular breathing. Clear bilaterally A&P w/o wheezes/rales/rhonchi. No accessory muscle use.  GI: BS present and normoactive. Soft, non-tender to palpation. No organomegaly or masses detected. MSK: No erythema, warmth or tenderness. Cap refil <2 sec all extrem. No deformities or joint swelling noted.  Neuro: A/Ox3. No focal deficits noted.   Skin: Warm, no lesions or rashe Psych: Normal affect and behavior. Judgement and thought content appropriate.     Lab Results:  CBC    Component Value Date/Time   WBC 6.2 06/07/2022 1134   RBC 4.40 06/07/2022 1134   HGB 13.3 06/07/2022 1134   HCT 39.7 06/07/2022 1134   PLT 218.0 06/07/2022 1134   MCV 90.2 06/07/2022 1134   MCH 31.2 11/02/2017 0928   MCHC 33.5 06/07/2022 1134   RDW 14.0 06/07/2022 1134   LYMPHSABS 2.4 06/07/2022 1134   MONOABS 0.7 06/07/2022 1134   EOSABS 0.4 06/07/2022 1134   BASOSABS 0.0 06/07/2022 1134    BMET    Component Value Date/Time   NA 138 11/02/2017 0459   K 3.9 11/02/2017 0459   CL 103 11/02/2017 0459   CO2 27 11/02/2017 0459   GLUCOSE 178 (H) 11/02/2017 0459   BUN 18 11/02/2017 0459   CREATININE 1.13 11/02/2017 0459   CALCIUM 8.7 (L) 11/02/2017 0459   GFRNONAA >60 11/02/2017 0459   GFRAA >60 11/02/2017 0459    BNP No results found for: "BNP"   Imaging:  No results found.  Administration History     None          Latest Ref Rng & Units 06/07/2022    4:17 PM  PFT Results  FVC-Predicted Pre % 79   FVC-Post L 3.01   FVC-Predicted Post % 81   Pre FEV1/FVC % % 82   Post FEV1/FCV % % 82   FEV1-Pre L 2.38   FEV1-Predicted Pre % 87   FEV1-Post L 2.46   DLCO uncorrected ml/min/mmHg 20.91   DLCO UNC% % 93   DLCO corrected ml/min/mmHg 20.91   DLCO COR %Predicted % 93   DLVA Predicted % 106   TLC L  4.58   TLC % Predicted % 76   RV % Predicted % 68     No results found for: "NITRICOXIDE"      Assessment & Plan:   Mild persistent asthma Stable. Appears compensated  on current regimen. Continue controller regimen with Breo and as needed albuterol.  Action plan in place.  Encouraged to remain active.  Continue trigger prevention measures.  Patient Instructions  Continue Albuterol inhaler 2 puffs every 6 hours as needed for shortness of breath or wheezing. Notify if symptoms persist despite rescue inhaler/neb use.  Continue Breo 1 puff daily. Brush tongue and rinse mouth afterwards   Use a daily allergy pill if you are going to be working outside    Follow up in 6 months with Dr. Tonia Brooms or Florentina Addison Zyren Sevigny,NP. If symptoms do not improve or worsen, please contact office for sooner follow up or seek emergency care.    Allergic rhinitis Well controlled. See above     I spent 25 minutes of dedicated to the care of this patient on the date of this encounter to include pre-visit review of records, face-to-face time with the patient discussing conditions above, post visit ordering of testing, clinical documentation with the electronic health record, making appropriate referrals as documented, and communicating necessary findings to members of the patients care team.  Noemi Chapel, NP 03/12/2023  Pt aware and understands NP's role.

## 2023-03-12 NOTE — Patient Instructions (Addendum)
Continue Albuterol inhaler 2 puffs every 6 hours as needed for shortness of breath or wheezing. Notify if symptoms persist despite rescue inhaler/neb use.  Continue Breo 1 puff daily. Brush tongue and rinse mouth afterwards   Use a daily allergy pill if you are going to be working outside    Follow up in 6 months with Dr. Tonia Brooms or Florentina Addison Dennisha Mouser,NP. If symptoms do not improve or worsen, please contact office for sooner follow up or seek emergency care.

## 2023-03-12 NOTE — Assessment & Plan Note (Signed)
Stable. Appears compensated on current regimen. Continue controller regimen with Breo and as needed albuterol.  Action plan in place.  Encouraged to remain active.  Continue trigger prevention measures.  Patient Instructions  Continue Albuterol inhaler 2 puffs every 6 hours as needed for shortness of breath or wheezing. Notify if symptoms persist despite rescue inhaler/neb use.  Continue Breo 1 puff daily. Brush tongue and rinse mouth afterwards   Use a daily allergy pill if you are going to be working outside    Follow up in 6 months with Dr. Tonia Brooms or Florentina Addison Nakeita Styles,NP. If symptoms do not improve or worsen, please contact office for sooner follow up or seek emergency care.

## 2023-03-12 NOTE — Assessment & Plan Note (Addendum)
Well-controlled. See above.

## 2023-07-22 ENCOUNTER — Ambulatory Visit: Payer: Medicare HMO | Admitting: Dermatology

## 2023-07-22 ENCOUNTER — Encounter: Payer: Self-pay | Admitting: Dermatology

## 2023-07-22 VITALS — BP 121/80 | HR 59

## 2023-07-22 DIAGNOSIS — B360 Pityriasis versicolor: Secondary | ICD-10-CM

## 2023-07-22 DIAGNOSIS — E854 Organ-limited amyloidosis: Secondary | ICD-10-CM | POA: Diagnosis not present

## 2023-07-22 DIAGNOSIS — L99 Other disorders of skin and subcutaneous tissue in diseases classified elsewhere: Secondary | ICD-10-CM | POA: Diagnosis not present

## 2023-07-22 MED ORDER — CLOBETASOL PROPIONATE 0.05 % EX CREA: 1.0000 | TOPICAL_CREAM | Freq: Two times a day (BID) | CUTANEOUS | 5 refills | Status: AC

## 2023-07-22 MED ORDER — TACROLIMUS 0.1 % EX OINT: TOPICAL_OINTMENT | Freq: Two times a day (BID) | CUTANEOUS | 6 refills | Status: AC

## 2023-07-22 MED ORDER — KETOCONAZOLE 2 % EX CREA: 1.0000 | TOPICAL_CREAM | Freq: Two times a day (BID) | CUTANEOUS | 5 refills | Status: AC

## 2023-07-22 NOTE — Progress Notes (Signed)
 New Patient Visit   Subjective  Rodney Maynard. is a 68 y.o. male who presents for the following: Rash  Patient states he  has rash located at the lower legs that he  would like to have examined. Patient reports the areas have been there for several months. He reports the areas are bothersome. Patient reports the areas are extremely itchy. Patient rates irritation 10 out of 10. He states that the areas have not spread. Patient reports he  has not previously been treated for these areas. Patient had a similar rash on his arms and was prescribed Ketoconazole Shampoo 2% that cleared the irritation. He states he feels he is allergic to something he has previously ate. He has never been formally allergy testing.  The following portions of the chart were reviewed this encounter and updated as appropriate: medications, allergies, medical history  Review of Systems:  No other skin or systemic complaints except as noted in HPI or Assessment and Plan.  Objective  Well appearing patient in no apparent distress; mood and affect are within normal limits.  A focused examination was performed of the following areas: B/L LE  Relevant exam findings are noted in the Assessment and Plan.              Assessment & Plan   1. Tinea versicolor - Assessment: Patient reports a history of tinea versicolor on the arms, which resolved after 1-2 weeks of treatment with ketoconazole shampoo. The condition is described as white, scaly patches on the skin caused by overgrowth of normal yeast. The rash is currently resolved. - Plan:    Apply ketoconazole cream once weekly for prevention    If recurrence occurs, apply ketoconazole cream twice daily for one month or until resolved    Follow up in 3 months to ensure no recurrence  2. Lichen amyloidosis - Assessment: Patient presents with chronic scratching on the legs, resulting in thickened skin. The condition is exacerbated by the patient's habit of using  a plastic bottle cap to scratch, which is more damaging than using fingernails. - Plan:    Apply clobetasol cream twice daily for 2 weeks    Switch to tacrolimus ointment daily after 2 weeks, continue until follow-up    Continue using CeraVe Anti-Itch cream as needed for symptomatic relief    Follow up in 3 months to assess improvement  3. Suspected food-related pruritus - Assessment: Patient reports itching after consuming certain foods, particularly noting issues with [place] food such as shrimp egg foo young. The clinician suspects this may be related to high salt content in restaurant food rather than a specific food allergy. Excess salt consumption may lead to leg swelling and subsequent itching. - Plan:    Monitor and document food intake and subsequent itching episodes    Observe for correlation between leg swelling and itching, especially after consuming high-salt meals    Elevate legs as needed    Wear compression stockings when standing    Maintain current exercise regimen    Limit salt intake, especially when eating out TINEA VERSICOLOR   Related Medications ketoconazole (NIZORAL) 2 % cream Apply 1 Application topically 2 (two) times daily. Apply once weekly, IF YOU HAVE A BREAK OUT ON YOUR BACK CHEST OR ARMS USE 2 TIMES DAILY UNTIL CLEARED LICHEN AMYLOIDOSIS (HCC)   Related Medications clobetasol cream (TEMOVATE) 0.05 % Apply 1 Application topically 2 (two) times daily. APPLY 2 TIMES DAILY FOR 2 WEEKS TO LOWER LEGS THEN STOP  AND ALTERNATE WITH TACROLIMUS tacrolimus (PROTOPIC) 0.1 % ointment Apply topically 2 (two) times daily. APPLY 2 TIMES DAILY FOR 2 WEEKS TO LOWER LEGS THEN STOP AND ALTERNATE WITH CLOBETASOL  Return in about 3 months (around 10/21/2023) for Lichen Amyloidosis F/U.  I, Nell Range, am acting as Neurosurgeon for [employer], DO.  Documentation: I have reviewed the above documentation for accuracy and completeness, and I agree with the above.  Langston Reusing, DO

## 2023-07-22 NOTE — Patient Instructions (Addendum)
 Hello Rodney Maynard,   Thank you for visiting today. Here is a summary of the key instructions:  Medications: - Apply ketoconazole cream once a week to prevent tinea versicolor from coming back   - If tinea versicolor returns, apply ketoconazole cream twice a day for about a month until it's gone - Use clobetasol cream twice a day for 2 weeks on legs - After 2 weeks, switch to tacrolimus cream and use daily on legs until follow-up appointment  Skin Care: - Continue using CeraVe Anti-Itch cream as needed  Lifestyle Changes: - Be careful about salt consumption, especially when eating out - Keep legs elevated when possible - Wear compression stockings when standing - Continue regular exercise routine  Follow-up: - Return for follow-up appointment in 3 months - Take pictures of any new rashes that appear  We look forward to seeing you at your next visit. If you have any questions or concerns before then, please do not hesitate to contact our office.  Warm regards,  Dr. [facility] Re[facility]      Important Information   Due to recent changes in healthcare laws, you may see results of your pathology and/or laboratory studies on MyChart before the doctors have had a chance to review them. We understand that in some cases there may be results that are confusing or concerning to you. Please understand that not all results are received at the same time and often the doctors may need to interpret multiple results in order to provide you with the best plan of care or course of treatment. Therefore, we ask that you please give [place] 2 business days to thoroughly review all your results before contacting the office for clarification. Should we see a critical lab result, you will be contacted sooner.     If You Need Anything After Your Visit   If you have any questions or concerns for your doctor, please call our main line at [phone]. If no one answers, please leave a voicemail as  directed and we will return your call as soon as possible. Messages left after 4 pm will be answered the following business day.    You may also send [place] a message via MyChart. We typically respond to MyChart messages within 1-2 business days.  For prescription refills, please ask your pharmacy to contact our office. Our fax number is [phone].  If you have an urgent issue when the clinic is closed that cannot wait until the next business day, you can page your doctor at the number below.     Please note that while we do our best to be available for urgent issues outside of office hours, we are not available 24/7.    If you have an urgent issue and are unable to reach [place], you may choose to seek medical care at your doctor's office, retail clinic, urgent care center, or emergency room.   If you have a medical emergency, please immediately call 911 or go to the emergency department. In the event of inclement weather, please call our main line at [phone] for an update on the status of any delays or closures.  Dermatology Medication Tips: Please keep the boxes that topical medications come in in order to help keep track of the instructions about where and how to use these. Pharmacies typically print the medication instructions only on the boxes and not directly on the medication tubes.   If your medication is too expensive, please contact our office at [phone] or send  [place] a message through MyChart.    We are unable to tell what your co-pay for medications will be in advance as this is different depending on your insurance coverage. However, we may be able to find a substitute medication at lower cost or fill out paperwork to get insurance to cover a needed medication.    If a prior authorization is required to get your medication covered by your insurance company, please allow [place] 1-2 business days to complete this process.   Drug prices often vary depending on where the prescription  is filled and some pharmacies may offer cheaper prices.   The website www.goodrx.com contains coupons for medications through different pharmacies. The prices here do not account for what the cost may be with help from insurance (it may be cheaper with your insurance), but the website can give you the price if you did not use any insurance.  - You can print the associated coupon and take it with your prescription to the pharmacy.  - You may also stop by our office during regular business hours and pick up a GoodRx coupon card.  - If you need your prescription sent electronically to a different pharmacy, notify our office through [facility] or by phone at [phone]

## 2023-07-24 ENCOUNTER — Telehealth: Payer: Self-pay

## 2023-07-25 DIAGNOSIS — R051 Acute cough: Secondary | ICD-10-CM | POA: Diagnosis not present

## 2023-07-25 DIAGNOSIS — J069 Acute upper respiratory infection, unspecified: Secondary | ICD-10-CM | POA: Diagnosis not present

## 2023-07-25 DIAGNOSIS — B360 Pityriasis versicolor: Secondary | ICD-10-CM | POA: Diagnosis not present

## 2023-09-21 DIAGNOSIS — E782 Mixed hyperlipidemia: Secondary | ICD-10-CM | POA: Diagnosis not present

## 2023-09-21 DIAGNOSIS — I10 Essential (primary) hypertension: Secondary | ICD-10-CM | POA: Diagnosis not present

## 2023-09-21 DIAGNOSIS — Z8546 Personal history of malignant neoplasm of prostate: Secondary | ICD-10-CM | POA: Diagnosis not present

## 2023-09-21 DIAGNOSIS — M1990 Unspecified osteoarthritis, unspecified site: Secondary | ICD-10-CM | POA: Diagnosis not present

## 2023-10-10 DIAGNOSIS — J4530 Mild persistent asthma, uncomplicated: Secondary | ICD-10-CM | POA: Diagnosis not present

## 2023-10-10 DIAGNOSIS — J321 Chronic frontal sinusitis: Secondary | ICD-10-CM | POA: Diagnosis not present

## 2023-10-28 ENCOUNTER — Ambulatory Visit: Admitting: Dermatology

## 2023-11-05 DIAGNOSIS — H2513 Age-related nuclear cataract, bilateral: Secondary | ICD-10-CM | POA: Diagnosis not present

## 2023-11-05 DIAGNOSIS — H179 Unspecified corneal scar and opacity: Secondary | ICD-10-CM | POA: Diagnosis not present

## 2023-11-05 DIAGNOSIS — H04123 Dry eye syndrome of bilateral lacrimal glands: Secondary | ICD-10-CM | POA: Diagnosis not present

## 2023-11-05 DIAGNOSIS — H0520 Unspecified exophthalmos: Secondary | ICD-10-CM | POA: Diagnosis not present

## 2023-11-05 DIAGNOSIS — H40013 Open angle with borderline findings, low risk, bilateral: Secondary | ICD-10-CM | POA: Diagnosis not present

## 2023-11-05 DIAGNOSIS — H43811 Vitreous degeneration, right eye: Secondary | ICD-10-CM | POA: Diagnosis not present

## 2023-11-21 DIAGNOSIS — E782 Mixed hyperlipidemia: Secondary | ICD-10-CM | POA: Diagnosis not present

## 2023-11-21 DIAGNOSIS — M1990 Unspecified osteoarthritis, unspecified site: Secondary | ICD-10-CM | POA: Diagnosis not present

## 2023-11-21 DIAGNOSIS — Z8546 Personal history of malignant neoplasm of prostate: Secondary | ICD-10-CM | POA: Diagnosis not present

## 2023-11-21 DIAGNOSIS — I10 Essential (primary) hypertension: Secondary | ICD-10-CM | POA: Diagnosis not present

## 2023-12-16 DIAGNOSIS — Z1331 Encounter for screening for depression: Secondary | ICD-10-CM | POA: Diagnosis not present

## 2023-12-16 DIAGNOSIS — Z0000 Encounter for general adult medical examination without abnormal findings: Secondary | ICD-10-CM | POA: Diagnosis not present

## 2023-12-22 DIAGNOSIS — Z8546 Personal history of malignant neoplasm of prostate: Secondary | ICD-10-CM | POA: Diagnosis not present

## 2023-12-22 DIAGNOSIS — I10 Essential (primary) hypertension: Secondary | ICD-10-CM | POA: Diagnosis not present

## 2023-12-22 DIAGNOSIS — E782 Mixed hyperlipidemia: Secondary | ICD-10-CM | POA: Diagnosis not present

## 2023-12-22 DIAGNOSIS — M1990 Unspecified osteoarthritis, unspecified site: Secondary | ICD-10-CM | POA: Diagnosis not present

## 2024-01-21 DIAGNOSIS — M1990 Unspecified osteoarthritis, unspecified site: Secondary | ICD-10-CM | POA: Diagnosis not present

## 2024-01-21 DIAGNOSIS — E782 Mixed hyperlipidemia: Secondary | ICD-10-CM | POA: Diagnosis not present

## 2024-01-21 DIAGNOSIS — I10 Essential (primary) hypertension: Secondary | ICD-10-CM | POA: Diagnosis not present

## 2024-01-21 DIAGNOSIS — Z8546 Personal history of malignant neoplasm of prostate: Secondary | ICD-10-CM | POA: Diagnosis not present

## 2024-01-25 DIAGNOSIS — Z23 Encounter for immunization: Secondary | ICD-10-CM | POA: Diagnosis not present

## 2024-01-31 ENCOUNTER — Other Ambulatory Visit (HOSPITAL_BASED_OUTPATIENT_CLINIC_OR_DEPARTMENT_OTHER): Payer: Self-pay | Admitting: Physician Assistant

## 2024-01-31 DIAGNOSIS — R10814 Left lower quadrant abdominal tenderness: Secondary | ICD-10-CM | POA: Diagnosis not present

## 2024-01-31 DIAGNOSIS — R1032 Left lower quadrant pain: Secondary | ICD-10-CM

## 2024-02-04 ENCOUNTER — Ambulatory Visit (HOSPITAL_BASED_OUTPATIENT_CLINIC_OR_DEPARTMENT_OTHER)
Admission: RE | Admit: 2024-02-04 | Discharge: 2024-02-04 | Disposition: A | Discharge status: Home / Self Care | Source: Ambulatory Visit | Attending: Physician Assistant | Admitting: Physician Assistant

## 2024-02-04 DIAGNOSIS — N281 Cyst of kidney, acquired: Secondary | ICD-10-CM | POA: Diagnosis not present

## 2024-02-04 DIAGNOSIS — R1032 Left lower quadrant pain: Secondary | ICD-10-CM | POA: Diagnosis not present

## 2024-02-04 DIAGNOSIS — K429 Umbilical hernia without obstruction or gangrene: Secondary | ICD-10-CM | POA: Diagnosis not present

## 2024-02-04 LAB — POCT I-STAT CREATININE: Creatinine, Ser: 1.8 mg/dL — ABNORMAL HIGH (ref 0.61–1.24)

## 2024-02-04 MED ORDER — IOHEXOL 300 MG/ML  SOLN
100.0000 mL | Freq: Once | INTRAMUSCULAR | Status: AC | PRN
  Administered 2024-02-04: 100 mL via INTRAVENOUS

## 2024-02-07 DIAGNOSIS — L2089 Other atopic dermatitis: Secondary | ICD-10-CM | POA: Diagnosis not present

## 2024-02-07 DIAGNOSIS — J301 Allergic rhinitis due to pollen: Secondary | ICD-10-CM | POA: Diagnosis not present

## 2024-02-07 DIAGNOSIS — J4530 Mild persistent asthma, uncomplicated: Secondary | ICD-10-CM | POA: Diagnosis not present

## 2024-02-21 DIAGNOSIS — I10 Essential (primary) hypertension: Secondary | ICD-10-CM | POA: Diagnosis not present

## 2024-02-21 DIAGNOSIS — E782 Mixed hyperlipidemia: Secondary | ICD-10-CM | POA: Diagnosis not present

## 2024-02-21 DIAGNOSIS — M1990 Unspecified osteoarthritis, unspecified site: Secondary | ICD-10-CM | POA: Diagnosis not present

## 2024-02-21 DIAGNOSIS — Z8546 Personal history of malignant neoplasm of prostate: Secondary | ICD-10-CM | POA: Diagnosis not present

## 2024-03-22 DIAGNOSIS — M1990 Unspecified osteoarthritis, unspecified site: Secondary | ICD-10-CM | POA: Diagnosis not present

## 2024-03-22 DIAGNOSIS — E782 Mixed hyperlipidemia: Secondary | ICD-10-CM | POA: Diagnosis not present

## 2024-03-22 DIAGNOSIS — Z8546 Personal history of malignant neoplasm of prostate: Secondary | ICD-10-CM | POA: Diagnosis not present

## 2024-03-22 DIAGNOSIS — I10 Essential (primary) hypertension: Secondary | ICD-10-CM | POA: Diagnosis not present

## 2024-04-16 ENCOUNTER — Emergency Department (HOSPITAL_COMMUNITY)

## 2024-04-16 ENCOUNTER — Encounter (HOSPITAL_COMMUNITY): Payer: Self-pay

## 2024-04-16 ENCOUNTER — Emergency Department (HOSPITAL_COMMUNITY)
Admission: EM | Admit: 2024-04-16 | Discharge: 2024-04-16 | Disposition: A | Discharge status: Home / Self Care | Attending: Emergency Medicine | Admitting: Emergency Medicine

## 2024-04-16 ENCOUNTER — Other Ambulatory Visit: Payer: Self-pay

## 2024-04-16 DIAGNOSIS — Y9389 Activity, other specified: Secondary | ICD-10-CM | POA: Diagnosis not present

## 2024-04-16 DIAGNOSIS — Z7982 Long term (current) use of aspirin: Secondary | ICD-10-CM | POA: Insufficient documentation

## 2024-04-16 DIAGNOSIS — S0001XA Abrasion of scalp, initial encounter: Secondary | ICD-10-CM | POA: Diagnosis not present

## 2024-04-16 DIAGNOSIS — S0990XA Unspecified injury of head, initial encounter: Secondary | ICD-10-CM | POA: Diagnosis not present

## 2024-04-16 DIAGNOSIS — I10 Essential (primary) hypertension: Secondary | ICD-10-CM | POA: Insufficient documentation

## 2024-04-16 DIAGNOSIS — W010XXA Fall on same level from slipping, tripping and stumbling without subsequent striking against object, initial encounter: Secondary | ICD-10-CM | POA: Diagnosis not present

## 2024-04-16 DIAGNOSIS — W19XXXA Unspecified fall, initial encounter: Secondary | ICD-10-CM

## 2024-04-16 NOTE — ED Provider Notes (Signed)
 " [place] EMERGENCY DEPARTMENT AT [facility] Provider Note   CSN: [id number] Arrival date & time: 04/16/24  8091     Patient presents with: Head Injury   Rodney LASSER Sr. is a 68 y.o. male.  Patient is a 67 year old male with a history of hypertension who presents to the ED for head injury that occurred just prior to arrival.  Patient states he was taking out the trash.  He notes the trash can was very full and he tried to climb in the trash can to push the trash down.  He states when he was trying to get out, the truck tipped causing him to fall.  He notes he hit the back of his head on the pavement.  Denies loss of consciousness or any nausea/vomiting.  He notes he is having some pain around the abrasion on his head but otherwise feels good.  He takes aspirin  a couple times a month but no other blood thinners.  Denies headache, dizziness, syncope, chest pain, shortness of breath, numbness/tingling/weakness.  No further complaints.    Head Injury Associated symptoms: no headaches        Prior to Admission medications  Medication Sig Start Date End Date Taking? Authorizing Provider  acetaminophen  (ACETAMINOPHEN  EXTRA STRENGTH) 500 MG tablet 1 tablet as needed    [provider]  amLODipine  (NORVASC ) 10 MG tablet Take 10 mg by mouth every morning.    [provider]  aspirin  (ASPIRIN  81) 81 MG chewable tablet 1 tablet    [provider]  aspirin  81 MG chewable tablet Chew 1 tablet (81 mg total) by mouth 2 (two) times daily. 11/02/17   Vernetta Lonni GRADE, MD  BREO ELLIPTA  100-25 MCG/ACT AEPB INHALE 1 PUFF BY MOUTH INTO LUNGS DAILY 10/01/22   Icard, Adine L, DO  clobetasol  cream (TEMOVATE ) 0.05 % Apply 1 Application topically 2 (two) times daily. APPLY 2 TIMES DAILY FOR 2 WEEKS TO LOWER LEGS THEN STOP AND ALTERNATE WITH TACROLIMUS  07/22/23   Alm Delon SAILOR, DO  Cyanocobalamin (VITAMIN B 12) 500 MCG TABS 1 tablet    [provider]  diphenhydrAMINE  HCl (ZZZQUIL) 50 MG/30ML LIQD Take 25 mg by mouth at bedtime as needed (sleep).    [provider]  gabapentin  (NEURONTIN ) 300 MG capsule Take 300 mg by mouth 3 (three) times daily.    [provider]  hydrOXYzine (ATARAX/VISTARIL) 25 MG tablet Take 25 mg by mouth at bedtime as needed. 07/14/20   [provider]  indomethacin (INDOCIN SR) 75 MG CR capsule Take 75 mg by mouth daily with breakfast.    [provider]  lisinopril -hydrochlorothiazide  (PRINZIDE ,ZESTORETIC ) 20-25 MG per tablet Take 1 tablet by mouth every morning.     [provider]  meloxicam (MOBIC) 15 MG tablet Take 1 tablet by mouth daily. 07/25/20   [provider]  methocarbamol  (ROBAXIN ) 500 MG tablet TAKE ONE TABLET BY MOUTH EVERY 6 HOURS AS NEEDED FOR MUSCLE SPASMS 11/28/17   Gretta Bertrum ORN, PA-C  metoprolol  succinate (TOPROL -XL) 100 MG 24 hr tablet Take 100 mg by mouth 2 (two) times daily. 06/08/23   [provider]  metoprolol  tartrate (LOPRESSOR ) 50 MG tablet Take by mouth. 11/16/11   [provider]  Multiple Vitamin (MULTIVITAMINS PO) 1 tablet    [provider]  naproxen sodium (ALEVE) 220 MG tablet Take 220 mg by mouth at bedtime as needed (pain).    [provider]  Omega-3 Fatty Acids (FISH  OIL PO) Take 520 mg by mouth daily.    [provider]  oxyCODONE  (OXY IR/ROXICODONE ) 5 MG immediate release tablet Take 1-2 tablets (5-10 mg total) by mouth every 4 (four) hours as needed for moderate pain (pain score 4-6). 11/18/17   Vernetta Lonni GRADE, MD  tacrolimus  (PROTOPIC ) 0.1 % ointment Apply topically 2 (two) times daily. APPLY 2 TIMES DAILY FOR 2 WEEKS TO LOWER LEGS THEN STOP AND ALTERNATE WITH CLOBETASOL  07/22/23   Alm Delon SAILOR, DO  triamcinolone ointment (KENALOG) 0.1 % 1 application 10/28/19   [provider]  VENTOLIN  HFA 108 (90 Base) MCG/ACT inhaler Inhale 1-2 puffs into the lungs every 4  (four) hours as needed for wheezing or shortness of breath. 04/26/22   Icard, Adine CROME, DO  white petrolatum (VASELINE) GEL Apply 1 application topically as needed (itching).    [provider]    Allergies: Latex    Review of Systems  Respiratory:  Negative for shortness of breath.   Cardiovascular:  Negative for chest pain.  Skin:  Positive for wound.  Neurological:  Negative for dizziness, syncope, weakness and headaches.  All other systems reviewed and are negative.   Updated Vital Signs BP (!) 152/90 (BP Location: Left Arm)   Pulse 94   Temp 98.9 F (37.2 C) (Oral)   Resp 18   SpO2 95%   Physical Exam Constitutional:      Appearance: Normal appearance.  HENT:     Head: Normocephalic.     Comments: Mild abrasion noted to the crown of the occiput on the head.  Mild surrounding hematoma.  No active bleeding or drainage.  No lacerations.    Mouth/Throat:     Mouth: Mucous membranes are moist.     Pharynx: Oropharynx is clear.  Cardiovascular:     Rate and Rhythm: Normal rate.  Pulmonary:     Effort: Pulmonary effort is normal.  Musculoskeletal:        General: Normal range of motion.  Skin:    General: Skin is warm and dry.  Neurological:     Mental Status: He is alert and oriented to person, place, and time.     Cranial Nerves: No cranial nerve deficit.  Psychiatric:        Mood and Affect: Mood normal.        Behavior: Behavior normal.     (all labs ordered are listed, but only abnormal results are displayed) Labs Reviewed - No data to display  EKG: None  Radiology: CT Head Wo Contrast Result Date: 04/16/2024 CLINICAL DATA:  Head trauma EXAM: CT HEAD WITHOUT CONTRAST TECHNIQUE: Contiguous axial images were obtained from the base of the skull through the vertex without intravenous contrast. RADIATION DOSE REDUCTION: This exam was performed according to the departmental dose-optimization program which includes automated exposure control, adjustment of  the mA and/or kV according to patient size and/or use of iterative reconstruction technique. COMPARISON:  Head CT 07/22/2014 FINDINGS: Brain: No acute territorial infarction, hemorrhage or intracranial mass. Mild chronic small vessel ischemic changes of the white matter. The ventricles are nonenlarged Vascular: No hyperdense vessels.  No unexpected calcification Skull: Normal. Negative for fracture or focal lesion. Sinuses/Orbits: Patchy mucosal thickening in the sinuses Other: None IMPRESSION: No CT evidence for acute intracranial abnormality. Mild chronic small vessel ischemic changes of the white matter. Electronically Signed   By: Luke Bun M.D.   On: 04/16/2024 20:55     Medications Ordered in the ED - No data  to display                               Medical Decision Making Patient is a 10-year-old male who presents to the ED for a fall that occurred just prior to arrival.  Notes hitting his head on the pavement but denies loss of consciousness or any nausea/vomiting.  He is not on blood thinners.  Please see detailed HPI above.  On exam patient is alert and well-appearing.  Physical exam as noted above.  Mild abrasion noted to the crown of the occiput but otherwise no acute abnormalities.  No acute neurologic deficits.  CT of the head obtained is negative for acute process.  Less concerns for intracranial abnormality or fracture with reassuring imaging.  Otherwise well-appearing, ambulatory, and vital signs stable.  Stable for discharge home.  Symptomatic care discussed.  Advised PCP follow-up in 1 week for any continued symptoms.  Return precautions provided.  Amount and/or Complexity of Data Reviewed Radiology: ordered.       Final diagnoses:  Fall, initial encounter  Injury of head, initial encounter  Abrasion of scalp, initial encounter    ED Discharge Orders     None          Rodney Maynard 04/16/24 2109    Mannie Pac T, DO 04/19/24 1534  "

## 2024-04-16 NOTE — ED Notes (Signed)
 Patient transported to CT

## 2024-04-16 NOTE — ED Triage Notes (Signed)
 Pt states that he was standing in his trash can trying to pack the trash down when the trash can turned over, Pt hit the back of his on concrete. Pt has abrasion to back of head. Denies LOC. Denies blood thinner.

## 2024-04-16 NOTE — Discharge Instructions (Addendum)
 Apply ice to the area to help with any swelling.  May take Tylenol  every 6 hours as needed for pain.  Follow-up with PCP in 1 week for any continued symptoms.  Return if symptoms worsen including severe headache, uncontrolled nausea/vomiting, syncopal episode.
